# Patient Record
Sex: Female | Born: 1950 | Race: White | Hispanic: No | Marital: Married | Smoking: Never smoker
Health system: Southern US, Community
[De-identification: ages and names within clinical notes are randomized; demographics above are authoritative.]

## PROBLEM LIST (undated history)

## (undated) DIAGNOSIS — E079 Disorder of thyroid, unspecified: Secondary | ICD-10-CM

## (undated) DIAGNOSIS — E119 Type 2 diabetes mellitus without complications: Secondary | ICD-10-CM

## (undated) HISTORY — PX: CHOLECYSTECTOMY: SHX55

## (undated) HISTORY — PX: TOE SURGERY: SHX1073

## (undated) HISTORY — PX: OTHER SURGICAL HISTORY: SHX169

---

## 2003-03-14 ENCOUNTER — Ambulatory Visit (HOSPITAL_COMMUNITY): Admission: RE | Admit: 2003-03-14 | Discharge: 2003-03-14 | Payer: Self-pay | Admitting: *Deleted

## 2004-04-28 ENCOUNTER — Encounter: Admission: RE | Admit: 2004-04-28 | Discharge: 2004-06-11 | Payer: Self-pay | Admitting: Allergy

## 2007-03-14 ENCOUNTER — Ambulatory Visit (HOSPITAL_COMMUNITY): Admission: RE | Admit: 2007-03-14 | Discharge: 2007-03-14 | Payer: Self-pay | Admitting: *Deleted

## 2010-02-25 ENCOUNTER — Emergency Department (HOSPITAL_COMMUNITY): Admission: EM | Admit: 2010-02-25 | Discharge: 2010-02-25 | Payer: Self-pay | Admitting: Emergency Medicine

## 2010-09-16 ENCOUNTER — Ambulatory Visit (HOSPITAL_COMMUNITY): Admission: RE | Admit: 2010-09-16 | Discharge: 2010-09-16 | Payer: Self-pay | Admitting: Gastroenterology

## 2011-03-08 LAB — CBC
HCT: 42.3 % (ref 36.0–46.0)
Hemoglobin: 14.3 g/dL (ref 12.0–15.0)
MCHC: 33.7 g/dL (ref 30.0–36.0)
MCV: 89.9 fL (ref 78.0–100.0)
Platelets: 288 10*3/uL (ref 150–400)
WBC: 7.1 10*3/uL (ref 4.0–10.5)

## 2011-03-08 LAB — POCT I-STAT, CHEM 8
Calcium, Ion: 1.11 mmol/L — ABNORMAL LOW (ref 1.12–1.32)
Creatinine, Ser: 0.6 mg/dL (ref 0.4–1.2)
Glucose, Bld: 120 mg/dL — ABNORMAL HIGH (ref 70–99)
HCT: 44 % (ref 36.0–46.0)
Hemoglobin: 15 g/dL (ref 12.0–15.0)

## 2011-03-08 LAB — DIFFERENTIAL
Basophils Relative: 0 % (ref 0–1)
Eosinophils Absolute: 0.1 10*3/uL (ref 0.0–0.7)
Lymphs Abs: 1.6 10*3/uL (ref 0.7–4.0)
Neutrophils Relative %: 72 % (ref 43–77)

## 2011-03-08 LAB — POCT CARDIAC MARKERS
CKMB, poc: <1 ng/mL — ABNORMAL LOW (ref 1.0–8.0)
Troponin i, poc: <0.05 ng/mL (ref 0.00–0.09)

## 2011-03-08 LAB — URINE CULTURE

## 2011-03-08 LAB — URINALYSIS, ROUTINE W REFLEX MICROSCOPIC
Nitrite: NEGATIVE
Protein, ur: NEGATIVE mg/dL
Urobilinogen, UA: 1 mg/dL (ref 0.0–1.0)

## 2011-03-08 LAB — URINE MICROSCOPIC-ADD ON

## 2011-04-30 NOTE — Op Note (Signed)
   NAME:  Leah Carney, Leah Carney                        ACCOUNT NO.:  [id number]   MEDICAL RECORD NO.:  [id number]                   PATIENT TYPE:  AMB   LOCATION:  ENDO                                 FACILITY:  [facility]   PHYSICIAN:  Georgiana Spinner, M.D.                 DATE OF BIRTH:  1951-06-14   DATE OF PROCEDURE:  03/14/2003  DATE OF DISCHARGE:                                 OPERATIVE REPORT   PROCEDURE PERFORMED:  Upper endoscopy.   ENDOSCOPIST:  Georgiana Spinner, M.D.   INDICATIONS FOR PROCEDURE:  Dysphagia.   ANESTHESIA:  Demerol 80 mg, Versed 8.5 mg.   DESCRIPTION OF PROCEDURE:  With the patient mildly sedated in the left  lateral decubitus position, the Olympus video endoscope was inserted in the  mouth and passed under direct vision through the esophagus which appeared  normal.  We popped through the GE junction .  The fundus, body, antrum,  duodenal bulb and second portion of the duodenum all appeared normal.  From  this point, the endoscope was slowly withdrawn taking circumferential views  of the entire duodenal mucosa until the endoscope was pulled back into the  stomach and placed on retroflexion to view the stomach from below.  The  endoscope was then straightened and a guidewire was passed.  The endoscope  was withdrawn.  Subsequently Savary dilators, 14, 16, 18 were passed easily  over the guidewire.  With the latter, the guidewire was removed.  The  endoscope was reinserted.  No abnormalities were noted in the esophagus and  the stomach.  The endoscope was then withdrawn taking circumferential views  of the remaining gastric and esophageal mucosa.  The patient's vital signs  and pulse oximeter remained stable.  The patient tolerated the procedure  well without apparent complications.   FINDINGS:  Unremarkable examination with Savary dilation 14, 16 and 18 of  the distal esophagus.   PLAN:  Await clinical response.  The patient will follow up with me as an  outpatient,  proceed to colonoscopy as planned.                                               Georgiana Spinner, M.D.    GMO/MEDQ  D:  03/14/2003  T:  03/14/2003  Job:  [id number]

## 2011-04-30 NOTE — Op Note (Signed)
NAMECHYREL, TAHA              ACCOUNT NO.:  [id number]   MEDICAL RECORD NO.:  [id number]          PATIENT TYPE:  AMB   LOCATION:  ENDO                         FACILITY:  [facility]   PHYSICIAN:  Georgiana Spinner, M.D.    DATE OF BIRTH:  [birth year]   DATE OF PROCEDURE:  DATE OF DISCHARGE:                               OPERATIVE REPORT   PROCEDURE:  Upper endoscopy with Savary dilation.   INDICATIONS:  Dysphagia.   ANESTHESIA:  Fentanyl 70 mcg, Versed 7 mg.   PROCEDURE:  With the patient mildly sedated in the left lateral  decubitus position, the Pentax videoscopic endoscope was inserted and  passed under direct vision through the esophagus which appeared normal  into the stomach.  The fundus, body, antrum, duodenal bulb, and second  portion of the duodenum were visualized.  From this point the endoscope  was slowly withdrawn, taking circumferential views of the duodenal  mucosa until the endoscope had been pulled back into the stomach, placed  in retroflexion to view the stomach from below.  The endoscope was  straightened and withdrawn after passing a guidewire.  Subsequently, a  17 Savary dilator was passed over the guidewire with fluoroscopic  guidance.  Once it had entered into the stomach, the guidewire and the  dilator were withdrawn.  The endoscope was then reinserted into the  stomach and withdrawn.  The patient's vital signs and pulse oximeter  remained stable.  The patient tolerated the procedure well without  apparent complications.   FINDINGS:  Tightness of the distal esophagus, dilated to 17 mm.  Await  clinical response.  The patient will follow up with me as needed as an  outpatient.           ______________________________  Georgiana Spinner, M.D.     GMO/MEDQ  D:  03/14/2007  T:  03/14/2007  Job:  [id number]

## 2011-04-30 NOTE — Op Note (Signed)
   NAME:  Leah Carney, Leah Carney                        ACCOUNT NO.:  [id number]   MEDICAL RECORD NO.:  [id number]                   PATIENT TYPE:  AMB   LOCATION:  ENDO                                 FACILITY:  [facility]   PHYSICIAN:  Georgiana Spinner, M.D.                 DATE OF BIRTH:  Nov 19, 1951   DATE OF PROCEDURE:  03/14/2003  DATE OF DISCHARGE:                                 OPERATIVE REPORT   PROCEDURE PERFORMED:  Colonoscopy.   ENDOSCOPIST:  Georgiana Spinner, M.D.   INDICATIONS FOR PROCEDURE:  Colon cancer screening.   ANESTHESIA:  Demerol 90 mg, Versed 9 mg.   DESCRIPTION OF PROCEDURE:  With the patient mildly sedated in the left  lateral decubitus position, the Olympus video colonoscope was inserted in  the rectum and passed under direct vision through a very tortuous colon.  The patient was positioned in multiple positions with pressure applied to  the abdomen.  We finally reached the cecum, identified by the ileocecal  valve and crow's foot of the cecum.  From this point the colonoscope was  slowly withdrawn taking circumferential views of the entire colonic mucosa  visualized, stopping only in the rectum which appeared normal on direct and  showed hemorrhoids on retroflex view.  The endoscope was straightened and  withdrawn.  The patient's vital signs and pulse oximeter remained stable.  The patient tolerated the procedure well without apparent complications.   FINDINGS:  Internal hemorrhoids.  Otherwise unremarkable colonoscopic  examination notable for tortuosity in length of the colon.                                                   Georgiana Spinner, M.D.    GMO/MEDQ  D:  03/14/2003  T:  03/14/2003  Job:  [id number]

## 2014-12-14 ENCOUNTER — Emergency Department (HOSPITAL_COMMUNITY)
Admission: EM | Admit: 2014-12-14 | Discharge: 2014-12-14 | Disposition: A | Discharge status: Home / Self Care | Attending: Emergency Medicine | Admitting: Emergency Medicine

## 2014-12-14 ENCOUNTER — Encounter (HOSPITAL_COMMUNITY): Payer: Self-pay | Admitting: Emergency Medicine

## 2014-12-14 DIAGNOSIS — Y9389 Activity, other specified: Secondary | ICD-10-CM | POA: Diagnosis not present

## 2014-12-14 DIAGNOSIS — Y92009 Unspecified place in unspecified non-institutional (private) residence as the place of occurrence of the external cause: Secondary | ICD-10-CM | POA: Insufficient documentation

## 2014-12-14 DIAGNOSIS — X58XXXA Exposure to other specified factors, initial encounter: Secondary | ICD-10-CM | POA: Diagnosis not present

## 2014-12-14 DIAGNOSIS — Z79899 Other long term (current) drug therapy: Secondary | ICD-10-CM | POA: Insufficient documentation

## 2014-12-14 DIAGNOSIS — Y998 Other external cause status: Secondary | ICD-10-CM | POA: Diagnosis not present

## 2014-12-14 DIAGNOSIS — T7840XA Allergy, unspecified, initial encounter: Secondary | ICD-10-CM

## 2014-12-14 DIAGNOSIS — T7806XA Anaphylactic reaction due to food additives, initial encounter: Secondary | ICD-10-CM

## 2014-12-14 DIAGNOSIS — R55 Syncope and collapse: Secondary | ICD-10-CM | POA: Diagnosis present

## 2014-12-14 LAB — CBC WITH DIFFERENTIAL/PLATELET
BASOS ABS: 0 10*3/uL (ref 0.0–0.1)
Basophils Relative: 0 % (ref 0–1)
EOS ABS: 0.1 10*3/uL (ref 0.0–0.7)
Eosinophils Relative: 0 % (ref 0–5)
HEMATOCRIT: 43.6 % (ref 36.0–46.0)
HEMOGLOBIN: 14.4 g/dL (ref 12.0–15.0)
Lymphocytes Relative: 15 % (ref 12–46)
Lymphs Abs: 2.7 10*3/uL (ref 0.7–4.0)
MCH: 30.3 pg (ref 26.0–34.0)
MCHC: 33 g/dL (ref 30.0–36.0)
MCV: 91.6 fL (ref 78.0–100.0)
MONO ABS: 1.1 10*3/uL — AB (ref 0.1–1.0)
Monocytes Relative: 7 % (ref 3–12)
Neutro Abs: 13.4 10*3/uL — ABNORMAL HIGH (ref 1.7–7.7)
Neutrophils Relative %: 78 % — ABNORMAL HIGH (ref 43–77)
Platelets: 271 10*3/uL (ref 150–400)
RBC: 4.76 MIL/uL (ref 3.87–5.11)
RDW: 13.1 % (ref 11.5–15.5)
WBC: 17.2 10*3/uL — ABNORMAL HIGH (ref 4.0–10.5)

## 2014-12-14 LAB — COMPREHENSIVE METABOLIC PANEL
ALBUMIN: 3.7 g/dL (ref 3.5–5.2)
ALK PHOS: 91 U/L (ref 39–117)
ALT: 25 U/L (ref 0–35)
ANION GAP: 10 (ref 5–15)
AST: 30 U/L (ref 0–37)
BILIRUBIN TOTAL: 0.4 mg/dL (ref 0.3–1.2)
BUN: 19 mg/dL (ref 6–23)
CALCIUM: 8.6 mg/dL (ref 8.4–10.5)
CHLORIDE: 104 meq/L (ref 96–112)
CO2: 26 mmol/L (ref 19–32)
CREATININE: 0.69 mg/dL (ref 0.50–1.10)
GFR calc Af Amer: >90 mL/min (ref >90)
GFR calc non Af Amer: >90 mL/min (ref >90)
GLUCOSE: 157 mg/dL — AB (ref 70–99)
POTASSIUM: 4.2 mmol/L (ref 3.5–5.1)
Sodium: 140 mmol/L (ref 135–145)
Total Protein: 7 g/dL (ref 6.0–8.3)

## 2014-12-14 LAB — TROPONIN I: Troponin I: <0.03 ng/mL (ref <0.031)

## 2014-12-14 LAB — LIPASE, BLOOD: LIPASE: 34 U/L (ref 11–59)

## 2014-12-14 MED ORDER — DIPHENHYDRAMINE HCL 50 MG/ML IJ SOLN
25.0000 mg | Freq: Once | INTRAMUSCULAR | Status: AC
  Administered 2014-12-14: 25 mg via INTRAVENOUS
  Filled 2014-12-14: qty 1

## 2014-12-14 MED ORDER — FAMOTIDINE 20 MG PO TABS: 20.0000 mg | ORAL_TABLET | Freq: Two times a day (BID) | ORAL | Status: DC

## 2014-12-14 MED ORDER — DIPHENHYDRAMINE HCL 25 MG PO CAPS
25.0000 mg | ORAL_CAPSULE | Freq: Once | ORAL | Status: AC
  Administered 2014-12-14: 25 mg via ORAL
  Filled 2014-12-14: qty 1

## 2014-12-14 MED ORDER — DIPHENHYDRAMINE HCL 25 MG PO TABS: 50.0000 mg | ORAL_TABLET | ORAL | Status: DC | PRN

## 2014-12-14 MED ORDER — FAMOTIDINE IN NACL 20-0.9 MG/50ML-% IV SOLN
20.0000 mg | Freq: Once | INTRAVENOUS | Status: AC
  Administered 2014-12-14: 20 mg via INTRAVENOUS
  Filled 2014-12-14: qty 50

## 2014-12-14 MED ORDER — PREDNISONE 20 MG PO TABS: ORAL_TABLET | ORAL | Status: DC

## 2014-12-14 MED ORDER — METHYLPREDNISOLONE SODIUM SUCC 125 MG IJ SOLR
125.0000 mg | Freq: Once | INTRAMUSCULAR | Status: AC
  Administered 2014-12-14: 125 mg via INTRAVENOUS
  Filled 2014-12-14: qty 2

## 2014-12-14 MED ORDER — SODIUM CHLORIDE 0.9 % IV BOLUS (SEPSIS)
500.0000 mL | Freq: Once | INTRAVENOUS | Status: AC
  Administered 2014-12-14: 500 mL via INTRAVENOUS

## 2014-12-14 MED ORDER — PREDNISONE 20 MG PO TABS
60.0000 mg | ORAL_TABLET | Freq: Once | ORAL | Status: AC
  Administered 2014-12-14: 60 mg via ORAL
  Filled 2014-12-14: qty 3

## 2014-12-14 MED ORDER — EPINEPHRINE 0.3 MG/0.3ML IJ SOAJ: 0.3000 mg | Freq: Once | INTRAMUSCULAR | Status: DC

## 2014-12-14 NOTE — ED Provider Notes (Signed)
CSN: [id number]     Arrival date & time 12/14/14  1715 History   First MD Initiated Contact with Patient 12/14/14 1731     Chief Complaint  Patient presents with  . Near Syncope     (Consider location/radiation/quality/duration/timing/severity/associated sxs/prior Treatment) HPI The patient had had a meal with her family. They were at home. She was feeling fine. She suddenly got a sensation of having to go to the bathroom. She made it into the bathroom and immediately had to have both diarrhea and the feeling of needing to vomit. She was able to sit on the commode and have a bowel movement. She reports she became extremely hot and was sweating profusely. She got up and walked into the hallway and collapsed. She reports that she was able to call for her family who came immediately to assist her. She was very flushed and profusely sweating still. Her failure who is with her reports that she was breathing rather rapidly as well. She denies she had chest pain. She reports she just felt immobilized and had pins and needles feelings all over her. The medics came and started an IV. No other medications were ad minister. The symptoms have improved significantly. She reports she is still extremely flushed and her hands and feet are swollen and itching. The patient denies history of an allergic reaction. She reports the foods were all things she has had previously. History reviewed. No pertinent past medical history. Past Surgical History  Procedure Laterality Date  . Cholecystectomy    . Fibroidectomy     No family history on file. History  Substance Use Topics  . Smoking status: Never Smoker   . Smokeless tobacco: Not on file  . Alcohol Use: No   OB History    No data available     Review of Systems 10 Systems reviewed and are negative for acute change except as noted in the HPI.    Allergies  Review of patient's allergies indicates no known allergies.  Home Medications   Prior to  Admission medications   Medication Sig Start Date End Date Taking? Authorizing Provider  aspirin-acetaminophen-caffeine (EXCEDRIN MIGRAINE) [phone] MG per tablet Take 2 tablets by mouth every 6 (six) hours as needed for headache.   Yes Historical Provider, MD  Calcium Citrate-Vitamin D (CITRACAL + D PO) Take 1 tablet by mouth daily.   Yes Historical Provider, MD  diphenhydrAMINE (BENADRYL) 25 MG tablet Take 25 mg by mouth every 6 (six) hours as needed for itching.   Yes Historical Provider, MD  Oxymetazoline HCl (MUCINEX NASAL SPRAY MOISTURE NA) Place 1 spray into the nose daily as needed (congestion).   Yes Historical Provider, MD  pseudoephedrine (SUDAFED) 60 MG tablet Take 60 mg by mouth every 4 (four) hours as needed for congestion.   Yes Historical Provider, MD  venlafaxine (EFFEXOR) 75 MG tablet Take 225 mg by mouth every morning.   Yes Historical Provider, MD  diphenhydrAMINE (BENADRYL) 25 MG tablet Take 2 tablets (50 mg total) by mouth every 4 (four) hours as needed for itching. 12/14/14   Arby Barrette, MD  EPINEPHrine (EPIPEN 2-PAK) 0.3 mg/0.3 mL IJ SOAJ injection Inject 0.3 mLs (0.3 mg total) into the muscle once. 12/14/14   Arby Barrette, MD  famotidine (PEPCID) 20 MG tablet Take 1 tablet (20 mg total) by mouth 2 (two) times daily. 12/14/14   Arby Barrette, MD  predniSONE (DELTASONE) 20 MG tablet 3 tabs po day one, then 2 po daily x 4 days  12/14/14   Arby Barrette, MD   BP 119/61 mmHg  Pulse 74  Temp(Src) 97.7 F (36.5 C) (Oral)  Resp 20  SpO2 99% Physical Exam  Constitutional: She is oriented to person, place, and time. She appears well-developed and well-nourished.  HENT:  Head: Normocephalic and atraumatic.  Mouth/Throat: No oropharyngeal exudate.  Posterior oropharynx is widely patent. There is no intraoral edema. No stridor.  Eyes: EOM are normal. Pupils are equal, round, and reactive to light.  Neck: Neck supple.  Cardiovascular: Normal rate, regular rhythm, normal heart  sounds and intact distal pulses.   Pulmonary/Chest: Effort normal and breath sounds normal. No respiratory distress. She has no wheezes.  Abdominal: Soft. Bowel sounds are normal. She exhibits no distension. There is no tenderness.  Musculoskeletal: Normal range of motion. She exhibits no edema.  Neurological: She is alert and oriented to person, place, and time. She has normal strength. Coordination normal. GCS eye subscore is 4. GCS verbal subscore is 5. GCS motor subscore is 6.  Skin: Skin is warm, dry and intact. Rash noted.  The patient upper body is very flushed. She has urticarial rash on her forearms. She also has urticarial rash and mild edema of both hands and both feet. The trunk and lower extremities are predominantly spared.  Psychiatric: She has a normal mood and affect.    ED Course  Procedures (including critical care time) Labs Review Labs Reviewed  COMPREHENSIVE METABOLIC PANEL - Abnormal; Notable for the following:    Glucose, Bld 157 (*)    All other components within normal limits  CBC WITH DIFFERENTIAL - Abnormal; Notable for the following:    WBC 17.2 (*)    Neutrophils Relative % 78 (*)    Neutro Abs 13.4 (*)    Monocytes Absolute 1.1 (*)    All other components within normal limits  LIPASE, BLOOD  TROPONIN I    Imaging Review No results found.   EKG Interpretation   Date/Time:  Saturday December 14 2014 17:28:27 EST Ventricular Rate:  78 PR Interval:  153 QRS Duration: 99 QT Interval:  408 QTC Calculation: 465 R Axis:   5 Text Interpretation:  Sinus rhythm Abnormal R-wave progression, early  transition normal. no change from old. Confirmed by Donnald Garre, MD, Le[facility] Conners  [phone]) on 12/14/2014 6:03:42 PM     21:15 rash has resolved. Vital signs are stable. Patient has not developed any rebound of symptoms. 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 It appears patient had an anaphylactic type reaction in the field. She did have diffuse urticarial rash of her upper body upon arrival here, however there was no wheezing present, no oral symptoms no dysphasia. Prior to arrival however classic GI symptoms with reported shortness of breath and a near syncopal type episode. The patient was not treated in the field. Her symptoms had begun to significantly abate prior to arrival. Upon her arrival the predominant remaining symptom was urticaria of her upper body and hands and feet. The patient does not have prior history of allergies. At this point is appears highly likely to be a food-related reaction. The patient is counseled on avoiding all foods that might have any types of dyes, preservatives or flavor enhancers. She is to go home and make a chart of all foods eaten today and try to obtain labeling.  The patient is then to follow-up with an allergist. They will be picking up an EpiPen after discharge and the patient and her husband who is a [employer] fundraiser are aware of signs and symptoms for which to return.    Arby Barrette, MD 12/14/14 2148

## 2014-12-14 NOTE — ED Notes (Signed)
2x attempt to collect blood right AC

## 2014-12-14 NOTE — ED Notes (Addendum)
Upon assessment pt reports talking with family and felt fine but urge to have BM. Pt reports with BM become sweaty, dizzy, and weak. Pt reports same episode four years ago and diagnosed with vasovagal syncope. Pt continues to reports generalized itching to hands at home; feet itching at present time; pt generalized redness.

## 2014-12-14 NOTE — Discharge Instructions (Signed)
Anaphylactic Reaction An anaphylactic reaction is a sudden, severe allergic reaction that involves the whole body. It can be life threatening. A hospital stay is often required. People with asthma, eczema, or hay fever are slightly more likely to have an anaphylactic reaction. CAUSES  An anaphylactic reaction may be caused by anything to which you are allergic. After being exposed to the allergic substance, your immune system becomes sensitized to it. When you are exposed to that allergic substance again, an allergic reaction can occur. Common causes of an anaphylactic reaction include:  Medicines.  Foods, especially peanuts, wheat, shellfish, milk, and eggs.  Insect bites or stings.  Blood products.  Chemicals, such as dyes, latex, and contrast material used for imaging tests. SYMPTOMS  When an allergic reaction occurs, the body releases histamine and other substances. These substances cause symptoms such as tightening of the airway. Symptoms often develop within seconds or minutes of exposure. Symptoms may include:  Skin rash or hives.  Itching.  Chest tightness.  Swelling of the eyes, tongue, or lips.  Trouble breathing or swallowing.  Lightheadedness or fainting.  Anxiety or confusion.  Stomach pains, vomiting, or diarrhea.  Nasal congestion.  A fast or irregular heartbeat (palpitations). DIAGNOSIS  Diagnosis is based on your history of recent exposure to allergic substances, your symptoms, and a physical exam. Your caregiver may also perform blood or urine tests to confirm the diagnosis. TREATMENT  Epinephrine medicine is the main treatment for an anaphylactic reaction. Other medicines that may be used for treatment include antihistamines, steroids, and albuterol. In severe cases, fluids and medicine to support blood pressure may be given through an intravenous line (IV). Even if you improve after treatment, you need to be observed to make sure your condition does not get  worse. This may require a stay in the hospital. Worth a medical alert bracelet or necklace stating your allergy.  You and your family must learn how to use an anaphylaxis kit or give an epinephrine injection to temporarily treat an emergency allergic reaction. Always carry your epinephrine injection or anaphylaxis kit with you. This can be lifesaving if you have a severe reaction.  Do not drive or perform tasks after treatment until the medicines used to treat your reaction have worn off, or until your caregiver says it is okay.  If you have hives or a rash:  Take medicines as directed by your caregiver.  You may use an over-the-counter antihistamine (diphenhydramine) as needed.  Apply cold compresses to the skin or take baths in cool water. Avoid hot baths or showers. SEEK MEDICAL CARE IF:   You develop symptoms of an allergic reaction to a new substance. Symptoms may start right away or minutes later.  You develop a rash, hives, or itching.  You develop new symptoms. SEEK IMMEDIATE MEDICAL CARE IF:   You have swelling of the mouth, difficulty breathing, or wheezing.  You have a tight feeling in your chest or throat.  You develop hives, swelling, or itching all over your body.  You develop severe vomiting or diarrhea.  You feel faint or pass out. This is an emergency. Use your epinephrine injection or anaphylaxis kit as you have been instructed. Call your local emergency services (911 in [place] Even if you improve after the injection, you need to be examined at a hospital emergency department. MAKE SURE YOU:   Understand these instructions.  Will watch your condition.  Will get help right away if you are not  doing well or get worse. Document Released: 11/29/2005 Document Revised: 12/04/2013 Document Reviewed: 03/01/2012 [employer] [facility] [employer] Patient Information 2015 [place], [place]. This information is not intended to replace advice given to you by your health  care provider. Make sure you discuss any questions you have with your health care provider.

## 2014-12-14 NOTE — ED Notes (Signed)
Per EMS pt was sitting with family and become hot, diaphoretic, and dizziness. Pt states need for BM post episode. Pt reports diarrhea and generalized body aches and weakness with movement. Pt given 550 NS and EKG read NSR unremarkable.

## 2014-12-14 NOTE — ED Notes (Signed)
Bed: WU13 Expected date:  Expected time:  Means of arrival:  Comments: Syncopal episode

## 2015-06-15 ENCOUNTER — Emergency Department (HOSPITAL_COMMUNITY)

## 2015-06-15 ENCOUNTER — Emergency Department (HOSPITAL_COMMUNITY)
Admission: EM | Admit: 2015-06-15 | Discharge: 2015-06-15 | Disposition: A | Discharge status: Home / Self Care | Attending: Emergency Medicine | Admitting: Emergency Medicine

## 2015-06-15 ENCOUNTER — Encounter (HOSPITAL_COMMUNITY): Payer: Self-pay

## 2015-06-15 DIAGNOSIS — Z79899 Other long term (current) drug therapy: Secondary | ICD-10-CM | POA: Diagnosis not present

## 2015-06-15 DIAGNOSIS — R109 Unspecified abdominal pain: Secondary | ICD-10-CM | POA: Diagnosis present

## 2015-06-15 DIAGNOSIS — N200 Calculus of kidney: Secondary | ICD-10-CM | POA: Diagnosis not present

## 2015-06-15 DIAGNOSIS — Z9049 Acquired absence of other specified parts of digestive tract: Secondary | ICD-10-CM | POA: Insufficient documentation

## 2015-06-15 LAB — CBC WITH DIFFERENTIAL/PLATELET
Basophils Absolute: 0.1 10*3/uL (ref 0.0–0.1)
Basophils Relative: 1 % (ref 0–1)
Eosinophils Absolute: 0.3 10*3/uL (ref 0.0–0.7)
Eosinophils Relative: 3 % (ref 0–5)
HCT: 43.6 % (ref 36.0–46.0)
Hemoglobin: 14.7 g/dL (ref 12.0–15.0)
Lymphocytes Relative: 36 % (ref 12–46)
Lymphs Abs: 3.8 10*3/uL (ref 0.7–4.0)
MCH: 30.2 pg (ref 26.0–34.0)
MCHC: 33.7 g/dL (ref 30.0–36.0)
MCV: 89.7 fL (ref 78.0–100.0)
Monocytes Absolute: 0.7 10*3/uL (ref 0.1–1.0)
Monocytes Relative: 7 % (ref 3–12)
Neutro Abs: 5.8 10*3/uL (ref 1.7–7.7)
Neutrophils Relative %: 53 % (ref 43–77)
Platelets: 289 10*3/uL (ref 150–400)
RBC: 4.86 MIL/uL (ref 3.87–5.11)
RDW: 13.3 % (ref 11.5–15.5)
WBC: 10.6 10*3/uL — ABNORMAL HIGH (ref 4.0–10.5)

## 2015-06-15 LAB — I-STAT CHEM 8, ED
BUN: 23 mg/dL — ABNORMAL HIGH (ref 6–20)
CALCIUM ION: 1.2 mmol/L (ref 1.13–1.30)
Chloride: 104 mmol/L (ref 101–111)
Creatinine, Ser: 0.9 mg/dL (ref 0.44–1.00)
Glucose, Bld: 171 mg/dL — ABNORMAL HIGH (ref 65–99)
HCT: 46 % (ref 36.0–46.0)
HEMOGLOBIN: 15.6 g/dL — AB (ref 12.0–15.0)
POTASSIUM: 4 mmol/L (ref 3.5–5.1)
Sodium: 142 mmol/L (ref 135–145)
TCO2: 24 mmol/L (ref 0–100)

## 2015-06-15 LAB — URINALYSIS, ROUTINE W REFLEX MICROSCOPIC
Glucose, UA: NEGATIVE mg/dL
Ketones, ur: NEGATIVE mg/dL
Leukocytes, UA: NEGATIVE
Nitrite: NEGATIVE
Protein, ur: NEGATIVE mg/dL
Specific Gravity, Urine: 1.041 — ABNORMAL HIGH (ref 1.005–1.030)
Urobilinogen, UA: 0.2 mg/dL (ref 0.0–1.0)
pH: 5 (ref 5.0–8.0)

## 2015-06-15 LAB — URINE MICROSCOPIC-ADD ON

## 2015-06-15 MED ORDER — HYDROCODONE-ACETAMINOPHEN 5-325 MG PO TABS: 1.0000 | ORAL_TABLET | ORAL | Status: DC | PRN

## 2015-06-15 MED ORDER — SODIUM CHLORIDE 0.9 % IV BOLUS (SEPSIS)
500.0000 mL | Freq: Once | INTRAVENOUS | Status: AC
  Administered 2015-06-15: 500 mL via INTRAVENOUS

## 2015-06-15 MED ORDER — KETOROLAC TROMETHAMINE 30 MG/ML IJ SOLN
30.0000 mg | Freq: Once | INTRAMUSCULAR | Status: AC
  Administered 2015-06-15: 30 mg via INTRAVENOUS
  Filled 2015-06-15: qty 1

## 2015-06-15 MED ORDER — ONDANSETRON HCL 4 MG/2ML IJ SOLN
4.0000 mg | Freq: Once | INTRAMUSCULAR | Status: AC
  Administered 2015-06-15: 4 mg via INTRAVENOUS
  Filled 2015-06-15: qty 2

## 2015-06-15 MED ORDER — HYDROMORPHONE HCL 1 MG/ML IJ SOLN
1.0000 mg | Freq: Once | INTRAMUSCULAR | Status: AC
  Administered 2015-06-15: 1 mg via INTRAVENOUS
  Filled 2015-06-15: qty 1

## 2015-06-15 MED ORDER — ONDANSETRON HCL 4 MG PO TABS: 4.0000 mg | ORAL_TABLET | Freq: Four times a day (QID) | ORAL | Status: DC

## 2015-06-15 MED ORDER — TAMSULOSIN HCL 0.4 MG PO CAPS: 0.4000 mg | ORAL_CAPSULE | Freq: Every day | ORAL | Status: DC

## 2015-06-15 NOTE — ED Notes (Signed)
Pt presents with c/o left side flank pain that started yesterday. Pt reports the pain is also radiating to her abdominal area. Pt reports some urinary frequency and urgency, denies any blood in her urine. Pt reports some nausea but no vomiting or diarrhea. No hx of kidney stones reported.

## 2015-06-15 NOTE — ED Notes (Signed)
Pt is tearful, in severe 10/10 pain, and nauseated again. MD notified.

## 2015-06-15 NOTE — ED Provider Notes (Signed)
CSN: [id number]     Arrival date & time 06/15/15  1521 History   First MD Initiated Contact with Patient 06/15/15 1531     Chief Complaint  Patient presents with  . Flank Pain  . Abdominal Pain     (Consider location/radiation/quality/duration/timing/severity/associated sxs/prior Treatment) HPI Comments: Pain initially was a 5/10 yesterday and became a 10 out of 10 today feeling like natural labor pains. Sharp and radiating into her left lower quadrant  Patient is a 64 y.o. female presenting with flank pain and abdominal pain. The history is provided by the patient.  Flank Pain This is a new (had pain in the left flank starting yesterday and got much worse at 12 today.) problem. The current episode started yesterday. The problem occurs constantly. The problem has been rapidly worsening. Associated symptoms include abdominal pain. Associated symptoms comments: Nausea without vomiting.  Sweating.  No fever or diarrhea. Urinary frequency, urgency and mild dysuria that started with the pain. No hematuria.. Nothing aggravates the symptoms. Nothing relieves the symptoms. Treatments tried: Excedrin. The treatment provided no relief.  Abdominal Pain   History reviewed. No pertinent past medical history. Past Surgical History  Procedure Laterality Date  . Cholecystectomy    . Fibroidectomy     No family history on file. History  Substance Use Topics  . Smoking status: Never Smoker   . Smokeless tobacco: Not on file  . Alcohol Use: No   OB History    No data available     Review of Systems  Gastrointestinal: Positive for abdominal pain.  Genitourinary: Positive for flank pain.  All other systems reviewed and are negative.     Allergies  Other  Home Medications   Prior to Admission medications   Medication Sig Start Date End Date Taking? Authorizing Provider  aspirin-acetaminophen-caffeine (EXCEDRIN MIGRAINE) [phone] MG per tablet Take 2 tablets by mouth every 6 (six) hours  as needed for headache.   Yes Historical Provider, MD  Calcium Citrate-Vitamin D (CITRACAL + D PO) Take 1 tablet by mouth daily.   Yes Historical Provider, MD  diphenhydrAMINE (BENADRYL) 25 MG tablet Take 2 tablets (50 mg total) by mouth every 4 (four) hours as needed for itching. 12/14/14  Yes Arby Barrette, MD  EPINEPHrine (EPIPEN 2-PAK) 0.3 mg/0.3 mL IJ SOAJ injection Inject 0.3 mLs (0.3 mg total) into the muscle once. 12/14/14  Yes Arby Barrette, MD  famotidine (PEPCID) 20 MG tablet Take 1 tablet (20 mg total) by mouth 2 (two) times daily. 12/14/14  Yes Arby Barrette, MD  Oxymetazoline HCl (MUCINEX NASAL SPRAY MOISTURE NA) Place 1 spray into the nose daily as needed (congestion).   Yes Historical Provider, MD  pseudoephedrine (SUDAFED) 60 MG tablet Take 60 mg by mouth every 4 (four) hours as needed for congestion.   Yes Historical Provider, MD  venlafaxine (EFFEXOR) 75 MG tablet Take 225 mg by mouth every morning.   Yes Historical Provider, MD  predniSONE (DELTASONE) 20 MG tablet 3 tabs po day one, then 2 po daily x 4 days Patient not taking: Reported on 06/15/2015 12/14/14   Arby Barrette, MD   BP 167/77 mmHg  Pulse 83  Temp(Src) 97.7 F (36.5 C) (Oral)  Resp 24  SpO2 97% Physical Exam  Constitutional: She is oriented to person, place, and time. She appears well-developed and well-nourished. She appears distressed.  Appears uncomfortable  HENT:  Head: Normocephalic and atraumatic.  Mouth/Throat: Oropharynx is clear and moist.  Eyes: Conjunctivae and EOM are normal. Pupils are  equal, round, and reactive to light.  Neck: Normal range of motion. Neck supple.  Cardiovascular: Normal rate, regular rhythm and intact distal pulses.   No murmur heard. Pulmonary/Chest: Effort normal and breath sounds normal. No respiratory distress. She has no wheezes. She has no rales.  Abdominal: Soft. Normal appearance. She exhibits no distension. There is tenderness in the left upper quadrant. There is CVA  tenderness. There is no rebound and no guarding.  Left flank pain  Musculoskeletal: Normal range of motion. She exhibits no edema or tenderness.  Neurological: She is alert and oriented to person, place, and time.  Skin: Skin is warm and dry. No rash noted. No erythema.  Psychiatric: She has a normal mood and affect. Her behavior is normal.  Nursing note and vitals reviewed.   ED Course  Procedures (including critical care time) Labs Review Labs Reviewed  CBC WITH DIFFERENTIAL/PLATELET - Abnormal; Notable for the following:    WBC 10.6 (*)    All other components within normal limits  URINALYSIS, ROUTINE W REFLEX MICROSCOPIC (NOT AT [facility]) - Abnormal; Notable for the following:    APPearance CLOUDY (*)    Specific Gravity, Urine 1.041 (*)    Hgb urine dipstick LARGE (*)    Bilirubin Urine SMALL (*)    All other components within normal limits  URINE MICROSCOPIC-ADD ON - Abnormal; Notable for the following:    Squamous Epithelial / LPF FEW (*)    Bacteria, UA FEW (*)    Crystals CA OXALATE CRYSTALS (*)    All other components within normal limits  I-STAT CHEM 8, ED    Imaging Review Ct Abdomen Pelvis Wo Contrast  06/15/2015   CLINICAL DATA:  Left flank pain starting yesterday.  EXAM: CT ABDOMEN AND PELVIS WITHOUT CONTRAST  TECHNIQUE: Multidetector CT imaging of the abdomen and pelvis was performed following the standard protocol without IV contrast.  COMPARISON:  None.  FINDINGS: There is left hydroureteronephrosis due to obstruction by 6 mm stone in the left ureterovesicular junction. There is no nephrolithiasis bilaterally.  The patient is status post prior cholecystectomy. There is mild fatty infiltration of liver. The spleen, pancreas, adrenal glands are normal. The aorta is normal. There is no abdominal lymphadenopathy. There is no small bowel obstruction or diverticulitis. There is diverticulosis of colon. The appendix is not seen but no inflammation is noted around cecum.  The  bladder is decompressed. Uterine fibroid is identified. Degenerative joint changes of the spine are noted.  IMPRESSION: Left hydroureteronephrosis due to obstruction by a 6 mm stone in the left ureterovesicular junction.   Electronically Signed   By: Sherian Rein M.D.   On: 06/15/2015 16:31     EKG Interpretation None      MDM   Final diagnoses:  Left flank pain  Kidney stone    Pt with symptoms consistent with kidney stone.  Denies infectious sx, or GI symptoms.  Low concern for diverticulitis and no risk factors or history suggestive of AAA.  No hx suggestive of GU source (discharge) and otherwise pt is healthy.  Will hydrate, treat pain and ensure no infection with UA, CBC, CMP and will get stone study to further eval.  5:01 PM Patient with evidence of a 6 mm kidney stone with left-sided hydronephrosis without complicating features such as infection. After the first dose of Dilaudid the pain improved and then returned 30 minutes later. She was given a dose of Dilaudid and Toradol.  6:05 PM Pt's pain is now controlled  2/10 and d/ced home  Gwyneth SproutWhitney Icholas Irby, MD 06/15/15 1805

## 2015-12-26 IMAGING — CT CT ABD-PELV W/O CM
2 of 3 series · 17 of 36 positions shown, 19 images · non-contrast
Comparison: None.

CLINICAL DATA: Left flank pain starting yesterday.

EXAM:
CT ABDOMEN AND PELVIS WITHOUT CONTRAST
TECHNIQUE: Multidetector CT imaging of the abdomen and pelvis was performed
following the standard protocol without IV contrast.

[Series 4: lung · axial · 0.88mm/px · z∈[-157,-62]mm · 14 of 22 slices shown, 16 images]
[im 2/22  soft-tissue]
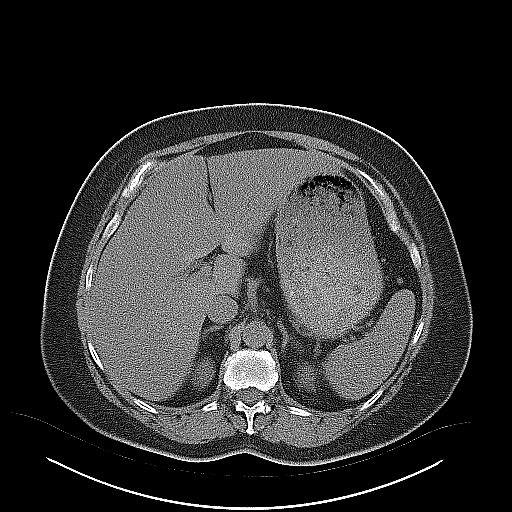
[im 2/22  bone]
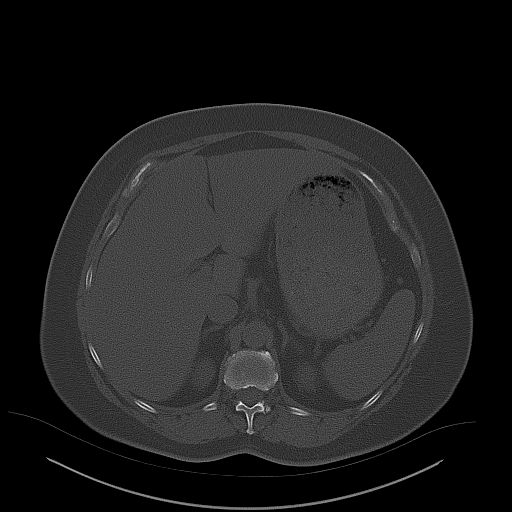
[im 4/22  soft-tissue]
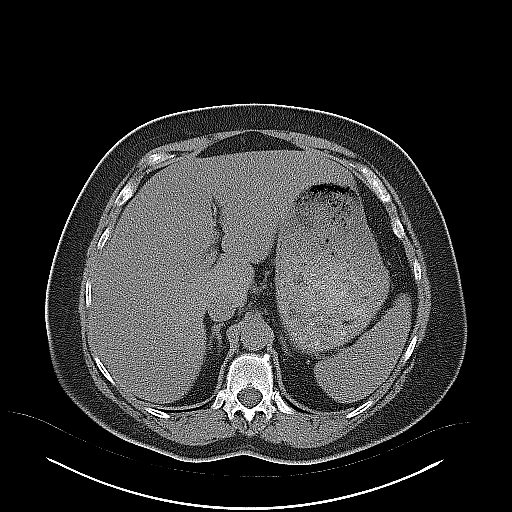
[im 5/22  soft-tissue]
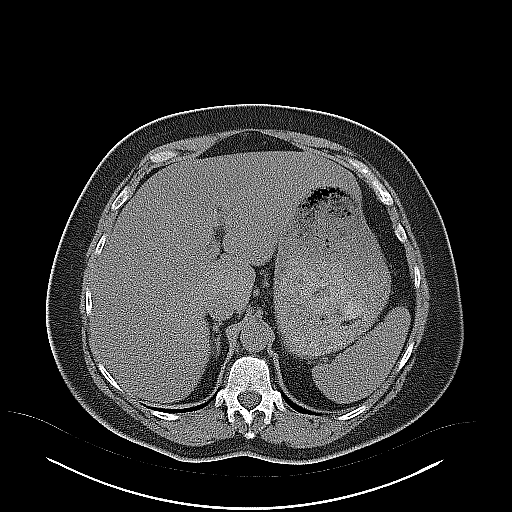
[im 7/22  soft-tissue]
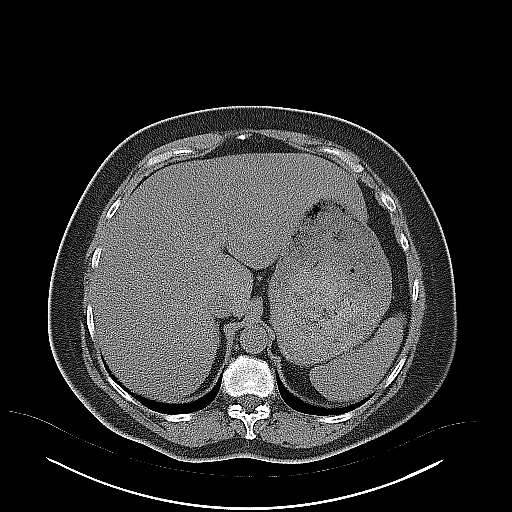
[im 8/22  soft-tissue]
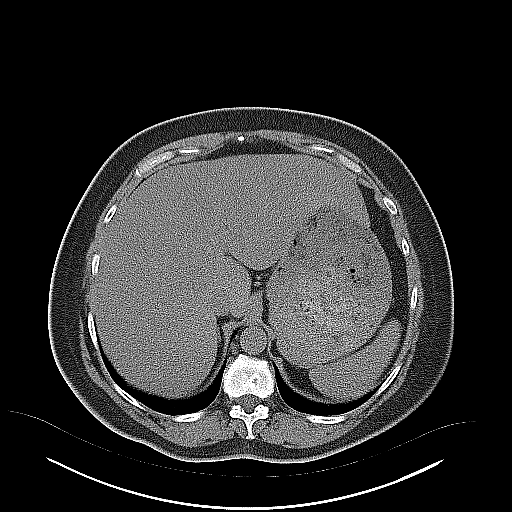
[im 9/22  soft-tissue]
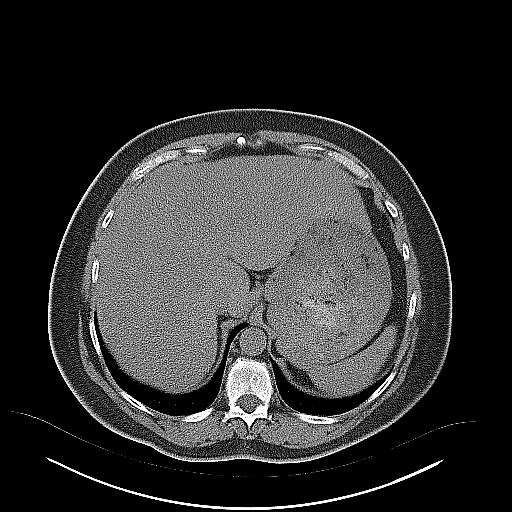
[im 11/22  soft-tissue]
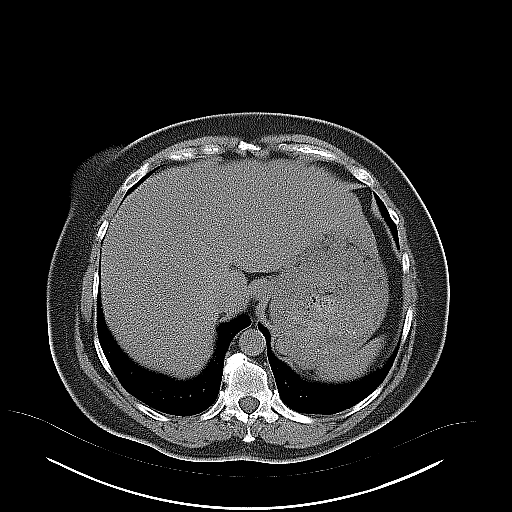
[im 12/22  soft-tissue]
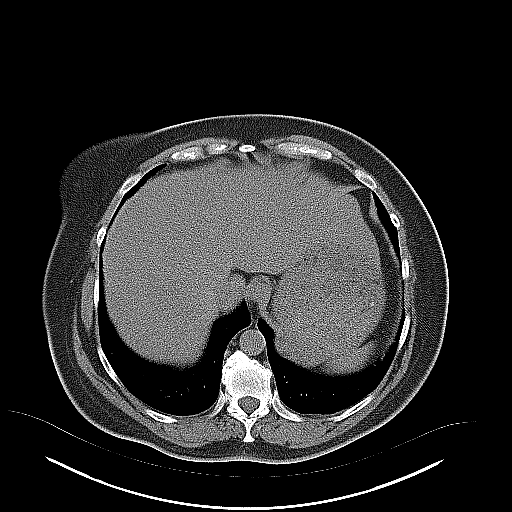
[im 14/22  soft-tissue]
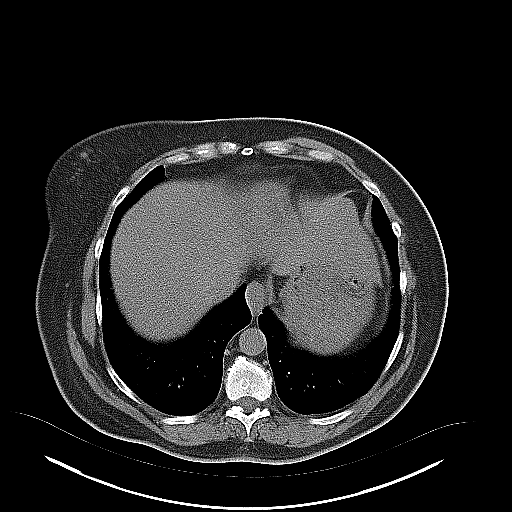
[im 14/22  bone]
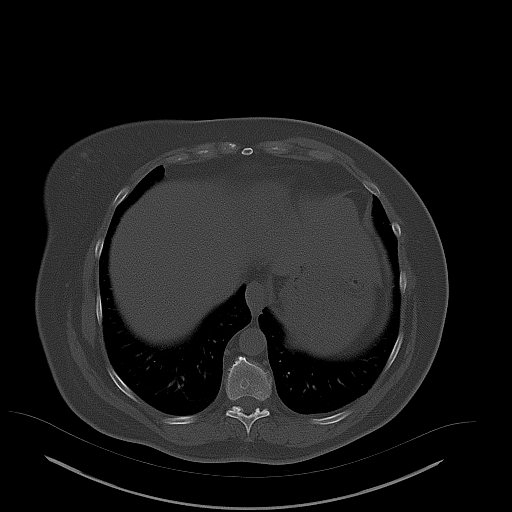
[im 15/22  soft-tissue]
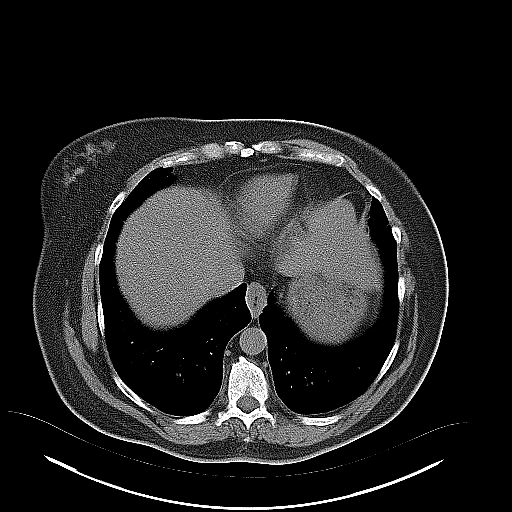
[im 17/22  soft-tissue]
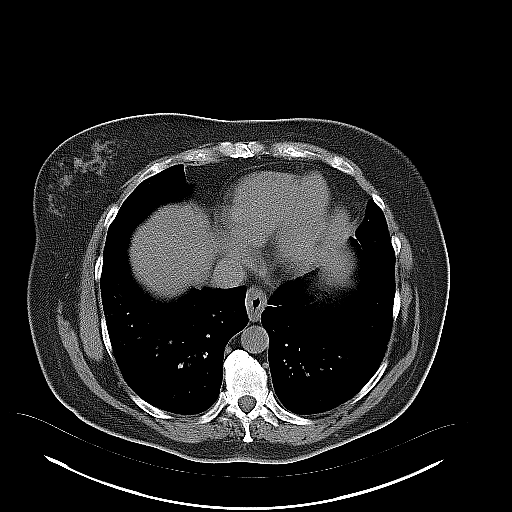
[im 18/22  soft-tissue]
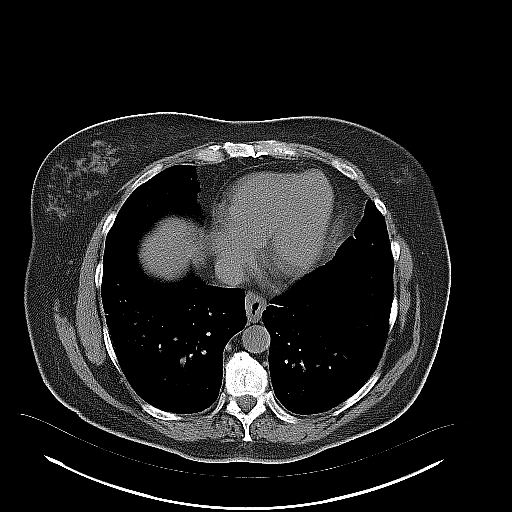
[im 19/22  soft-tissue]
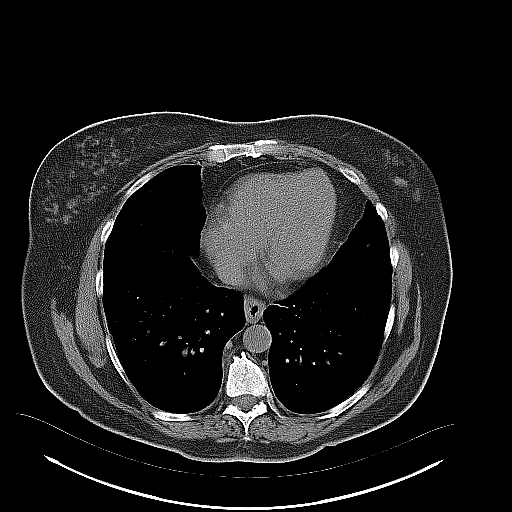
[im 21/22  soft-tissue]
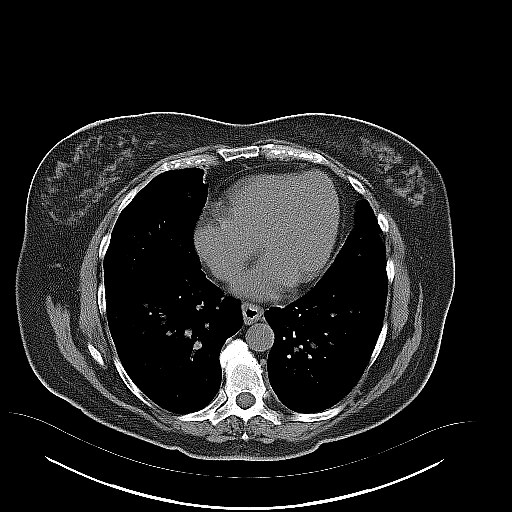

[Series 5: coronal · coronal · 0.91mm/px · 3 of 138 slices shown]
[im 46/138  soft-tissue]
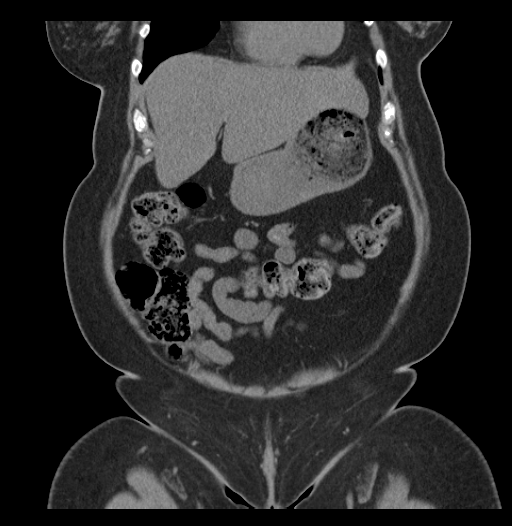
[im 61/138  soft-tissue]
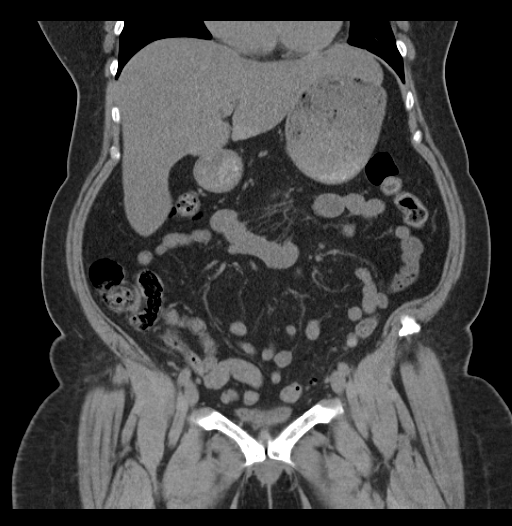
[im 77/138  soft-tissue]
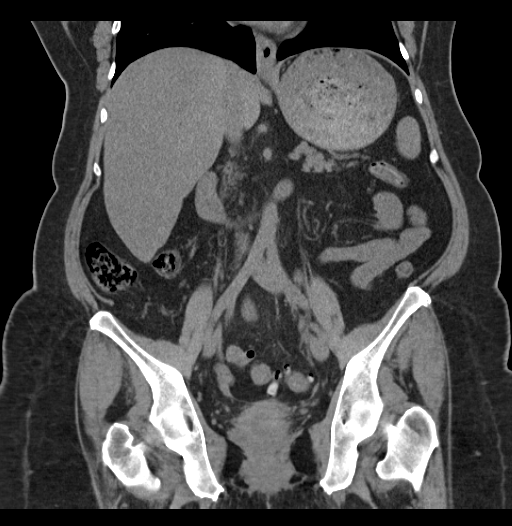

[17 of 36 positions shown; findings below may reference images not displayed]

FINDINGS: There is left hydroureteronephrosis due to obstruction by 6 mm stone
in the left ureterovesicular junction. There is no nephrolithiasis
bilaterally.

The patient is status post prior cholecystectomy. There is mild
fatty infiltration of liver. The spleen, pancreas, adrenal glands
are normal. The aorta is normal. There is no abdominal
lymphadenopathy. There is no small bowel obstruction or
diverticulitis. There is diverticulosis of colon. The appendix is
not seen but no inflammation is noted around cecum.

The bladder is decompressed. Uterine fibroid is identified.
Degenerative joint changes of the spine are noted.
IMPRESSION: Left hydroureteronephrosis due to obstruction by a 6 mm stone in the
left ureterovesicular junction.

## 2018-05-12 ENCOUNTER — Other Ambulatory Visit: Payer: Self-pay

## 2018-05-12 ENCOUNTER — Encounter (HOSPITAL_COMMUNITY): Payer: Self-pay | Admitting: Emergency Medicine

## 2018-05-12 ENCOUNTER — Emergency Department (HOSPITAL_COMMUNITY)
Admission: EM | Admit: 2018-05-12 | Discharge: 2018-05-12 | Disposition: A | Discharge status: Home / Self Care | Payer: Medicare Other | Attending: Emergency Medicine | Admitting: Emergency Medicine

## 2018-05-12 DIAGNOSIS — R55 Syncope and collapse: Secondary | ICD-10-CM | POA: Insufficient documentation

## 2018-05-12 DIAGNOSIS — Z7984 Long term (current) use of oral hypoglycemic drugs: Secondary | ICD-10-CM | POA: Insufficient documentation

## 2018-05-12 DIAGNOSIS — E119 Type 2 diabetes mellitus without complications: Secondary | ICD-10-CM | POA: Insufficient documentation

## 2018-05-12 DIAGNOSIS — Z79899 Other long term (current) drug therapy: Secondary | ICD-10-CM | POA: Insufficient documentation

## 2018-05-12 DIAGNOSIS — R197 Diarrhea, unspecified: Secondary | ICD-10-CM | POA: Diagnosis not present

## 2018-05-12 HISTORY — DX: Disorder of thyroid, unspecified: E07.9

## 2018-05-12 HISTORY — DX: Type 2 diabetes mellitus without complications: E11.9

## 2018-05-12 LAB — CBC WITH DIFFERENTIAL/PLATELET
Basophils Absolute: 0 10*3/uL (ref 0.0–0.1)
Basophils Relative: 0 %
Eosinophils Absolute: 0.1 10*3/uL (ref 0.0–0.7)
Eosinophils Relative: 1 %
HCT: 40.2 % (ref 36.0–46.0)
Hemoglobin: 13.4 g/dL (ref 12.0–15.0)
Lymphocytes Relative: 17 %
Lymphs Abs: 1.6 10*3/uL (ref 0.7–4.0)
MCH: 30.5 pg (ref 26.0–34.0)
MCHC: 33.3 g/dL (ref 30.0–36.0)
MCV: 91.4 fL (ref 78.0–100.0)
Monocytes Absolute: 0.5 10*3/uL (ref 0.1–1.0)
Monocytes Relative: 5 %
Neutro Abs: 7.2 10*3/uL (ref 1.7–7.7)
Neutrophils Relative %: 77 %
Platelets: 231 10*3/uL (ref 150–400)
RBC: 4.4 MIL/uL (ref 3.87–5.11)
RDW: 13.5 % (ref 11.5–15.5)
WBC: 9.4 10*3/uL (ref 4.0–10.5)

## 2018-05-12 LAB — COMPREHENSIVE METABOLIC PANEL
ALT: 24 U/L (ref 14–54)
AST: 24 U/L (ref 15–41)
Albumin: 3.7 g/dL (ref 3.5–5.0)
Alkaline Phosphatase: 102 U/L (ref 38–126)
Anion gap: 8 (ref 5–15)
BUN: 20 mg/dL (ref 6–20)
CO2: 24 mmol/L (ref 22–32)
Calcium: 8.4 mg/dL — ABNORMAL LOW (ref 8.9–10.3)
Chloride: 106 mmol/L (ref 101–111)
Creatinine, Ser: 0.68 mg/dL (ref 0.44–1.00)
GFR calc Af Amer: >60 mL/min (ref >60)
GFR calc non Af Amer: >60 mL/min (ref >60)
Glucose, Bld: 253 mg/dL — ABNORMAL HIGH (ref 65–99)
Potassium: 4 mmol/L (ref 3.5–5.1)
Sodium: 138 mmol/L (ref 135–145)
Total Bilirubin: 0.7 mg/dL (ref 0.3–1.2)
Total Protein: 7.1 g/dL (ref 6.5–8.1)

## 2018-05-12 LAB — URINALYSIS, ROUTINE W REFLEX MICROSCOPIC
Bilirubin Urine: NEGATIVE
Glucose, UA: 50 mg/dL — AB
Hgb urine dipstick: NEGATIVE
Ketones, ur: 5 mg/dL — AB
Leukocytes, UA: NEGATIVE
Nitrite: NEGATIVE
Protein, ur: NEGATIVE mg/dL
Specific Gravity, Urine: 1.015 (ref 1.005–1.030)
pH: 5 (ref 5.0–8.0)

## 2018-05-12 MED ORDER — SODIUM CHLORIDE 0.9 % IV BOLUS
1000.0000 mL | Freq: Once | INTRAVENOUS | Status: AC
  Administered 2018-05-12: 1000 mL via INTRAVENOUS

## 2018-05-12 NOTE — ED Notes (Signed)
Bed: WA21 Expected date:  Expected time:  Means of arrival:  Comments: EMS-vagal

## 2018-05-12 NOTE — ED Provider Notes (Signed)
[facility]-EMERGENCY DEPT Provider Note   CSN: [id number] Arrival date & time: 05/12/18  1204     History   Chief Complaint Chief Complaint  Patient presents with  . Loss of Consciousness    HPI Leah Carney is a 67 y.o. female with history of diabetes, thyroid disease who presents following vasovagal episode.  Patient reports she was on the toilet trying to make bowel movement, when she began feeling cool, clammy, lightheaded, pale.  She did not completely pass out.  She got up and called her husband who told her to call EMS.  On EMS arrival, patient very pale.  Patient reports she was having some mild abdominal cramping and feeling that she needed to make a bowel movement.  She strained and was able to pass a little bit.  She reports she is still having a little bit of feeling in her left lower quadrant that she needs to move her bowels.  She reports yesterday starting amoxicillin for an ear infection.  She was also told her PCP that her thyroid was low and was given medication which she was told to wait to begin taking.  She reports she has had a vasovagal episode before, but she was having allergic reaction at the same time.  Today patient denies any shortness of breath, rash, swelling of her lips, tongue, throat.  Patient is feeling better after EMS gave fluids.  Patient still feeling a little shaky.  Patient did need to be emergently seen which today.  She has no known allergies to this.  HPI  Past Medical History:  Diagnosis Date  . Diabetes mellitus without complication (HCC)   . Thyroid disease     There are no active problems to display for this patient.   Past Surgical History:  Procedure Laterality Date  . CHOLECYSTECTOMY    . fibroidectomy       OB History   None      Home Medications    Prior to Admission medications   Medication Sig Start Date End Date Taking? Authorizing Provider  amoxicillin (AMOXIL) 500 MG capsule Take 500 mg by  mouth 3 (three) times daily.   Yes [provider]  aspirin-acetaminophen-caffeine (EXCEDRIN MIGRAINE) [phone] MG tablet Take 2 tablets by mouth every 6 (six) hours as needed for headache or migraine.   Yes [provider]  diphenhydrAMINE (BENADRYL) 25 MG tablet Take 2 tablets (50 mg total) by mouth every 4 (four) hours as needed for itching. 12/14/14  Yes Arby Barrette, MD  EPINEPHrine (EPIPEN 2-PAK) 0.3 mg/0.3 mL IJ SOAJ injection Inject 0.3 mLs (0.3 mg total) into the muscle once. 12/14/14  Yes Arby Barrette, MD  metFORMIN (GLUCOPHAGE) 500 MG tablet Take 500 mg by mouth 2 (two) times daily with a meal.   Yes [provider]  pseudoephedrine (SUDAFED) 60 MG tablet Take 60 mg by mouth every 4 (four) hours as needed for congestion.   Yes [provider]  venlafaxine (EFFEXOR) 75 MG tablet Take 225 mg by mouth every morning.   Yes [provider]  famotidine (PEPCID) 20 MG tablet Take 1 tablet (20 mg total) by mouth 2 (two) times daily. Patient not taking: Reported on 05/12/2018 12/14/14   Arby Barrette, MD    Family History History reviewed. No pertinent family history.  Social History Social History   Tobacco Use  . Smoking status: Never Smoker  . Smokeless tobacco: Never Used  Substance Use Topics  . Alcohol use: No  .  Drug use: Not on file     Allergies   Other   Review of Systems Review of Systems  Constitutional: Positive for diaphoresis. Negative for chills and fever.  HENT: Negative for facial swelling and sore throat.   Respiratory: Negative for shortness of breath.   Cardiovascular: Negative for chest pain.  Gastrointestinal: Positive for nausea (resolved). Negative for abdominal pain, diarrhea and vomiting.  Genitourinary: Negative for dysuria.  Musculoskeletal: Negative for back pain.  Skin: Negative for rash and wound.  Neurological: Positive for syncope and light-headedness. Negative for headaches.    Psychiatric/Behavioral: The patient is not nervous/anxious.      Physical Exam Updated Vital Signs BP 108/80 (BP Location: Right Arm)   Pulse 79   Temp 98.2 F (36.8 C) (Oral)   Resp 20   Ht 5\' 5"  (1.651 m)   Wt 110.7 kg (244 lb)   SpO2 96%   BMI 40.60 kg/m   Physical Exam  Constitutional: She appears well-developed and well-nourished. No distress.  HENT:  Head: Normocephalic and atraumatic.  Right Ear: Tympanic membrane is erythematous (mild).  Mouth/Throat: Oropharynx is clear and moist. No oropharyngeal exudate.  L TM obstructed by cerumen  Eyes: Pupils are equal, round, and reactive to light. Conjunctivae are normal. Right eye exhibits no discharge. Left eye exhibits no discharge. No scleral icterus.  Neck: Normal range of motion. Neck supple. No thyromegaly present.  Cardiovascular: Normal rate, regular rhythm, normal heart sounds and intact distal pulses. Exam reveals no gallop and no friction rub.  No murmur heard. Pulmonary/Chest: Effort normal and breath sounds normal. No stridor. No respiratory distress. She has no wheezes. She has no rales.  Abdominal: Soft. Bowel sounds are normal. She exhibits no distension. There is no tenderness. There is no rebound and no guarding.  Musculoskeletal: She exhibits no edema.  Lymphadenopathy:    She has no cervical adenopathy.  Neurological: She is alert. Coordination normal.  Skin: Skin is warm and dry. No rash noted. She is not diaphoretic. No pallor.  Psychiatric: She has a normal mood and affect.  Nursing note and vitals reviewed.    ED Treatments / Results  Labs (all labs ordered are listed, but only abnormal results are displayed) Labs Reviewed  COMPREHENSIVE METABOLIC PANEL - Abnormal; Notable for the following components:      Result Value   Glucose, Bld 253 (*)    Calcium 8.4 (*)    All other components within normal limits  URINALYSIS, ROUTINE W REFLEX MICROSCOPIC - Abnormal; Notable for the following  components:   Glucose, UA 50 (*)    Ketones, ur 5 (*)    All other components within normal limits  CBC WITH DIFFERENTIAL/PLATELET    EKG EKG Interpretation  Date/Time:  Friday May 12 2018 12:22:35 EDT Ventricular Rate:  81 PR Interval:  150 QRS Duration: 84 QT Interval:  394 QTC Calculation: 457 R Axis:   8 Text Interpretation:  Normal sinus rhythm Normal ECG No significant change since last tracing Confirmed by Marily Memos [phone]) on 05/12/2018 1:37:35 PM   Radiology No results found.  Procedures Procedures (including critical care time)  Medications Ordered in ED Medications  sodium chloride 0.9 % bolus 1,000 mL (1,000 mLs Intravenous New Bag/Given 05/12/18 1319)     Initial Impression / Assessment and Plan / ED Course  I have reviewed the triage vital signs and the nursing notes.  Pertinent labs & imaging results that were available during my care of the patient were reviewed  by me and considered in my medical decision making (see chart for details).     Patient presenting following suspected vasovagal near syncope.  Patient was having a bowel movement and diarrhea when she felt pale and clammy.  Labs are unremarkable.  UA negative.  Patient given fluids and is feeling much better in the ED.  Orthostatics are stable.  Suspect diarrhea related to recent amoxicillin.  Patient only had one episode.  I would advise to continue that for otitis media.  Follow-up to PCP in 2 to 3 days.  Return precautions discussed.  Patient understands and agrees with plan.  Patient vitals stable throughout ED course and discharged in satisfactory condition. I discussed patient case with Dr. Clayborne DanaMesner who agrees with plan.   Final Clinical Impressions(s) / ED Diagnoses   Final diagnoses:  Vasovagal episode  Diarrhea, unspecified type    ED Discharge Orders    None       Emi HolesLaw, Denzil Bristol M, PA-C 05/12/18 1559    Mesner, Barbara CowerJason, MD 05/12/18 [phone]

## 2018-05-12 NOTE — Discharge Instructions (Signed)
Please follow-up with your doctor on Monday or Tuesday.  Make sure to stay well-hydrated.  Please return the emergency department if you develop any new or worsening symptoms.

## 2018-05-12 NOTE — ED Triage Notes (Signed)
Patient BIB [facility] from home for syncopal Vaso-Vagal episode. Wt was having a BM, passed out less than one minute. Medical illustratorire dept. Reports pt was pale, cool, and clammy on arrival. Pt a/o x4. Pt reports having a vagal episode several years ago that shortly led to some type of allergic reaction. Pt started taking Amoxicillin last night for ear infection.

## 2018-05-15 ENCOUNTER — Other Ambulatory Visit: Payer: Self-pay | Admitting: Family Medicine

## 2018-05-15 DIAGNOSIS — M8580 Other specified disorders of bone density and structure, unspecified site: Secondary | ICD-10-CM

## 2019-12-14 HISTORY — PX: CATARACT EXTRACTION: SUR2

## 2020-07-10 ENCOUNTER — Other Ambulatory Visit: Payer: Self-pay

## 2020-07-10 ENCOUNTER — Encounter: Payer: Self-pay | Admitting: Podiatry

## 2020-07-10 ENCOUNTER — Ambulatory Visit (INDEPENDENT_AMBULATORY_CARE_PROVIDER_SITE_OTHER): Payer: Medicare Other

## 2020-07-10 ENCOUNTER — Ambulatory Visit (INDEPENDENT_AMBULATORY_CARE_PROVIDER_SITE_OTHER): Payer: Medicare Other | Admitting: Podiatry

## 2020-07-10 DIAGNOSIS — M2042 Other hammer toe(s) (acquired), left foot: Secondary | ICD-10-CM

## 2020-07-10 DIAGNOSIS — M2041 Other hammer toe(s) (acquired), right foot: Secondary | ICD-10-CM

## 2020-07-10 NOTE — Progress Notes (Signed)
Subjective:   Patient ID: Leah Carney, female   DOB: 69 y.o.   MRN: [id number]   HPI Patient presents stating the second toe my left foot has become much more painful over the last few months and I am having trouble wearing shoe gear.  I's cannot wear closed in shoes and I tried to make some changes without success and patient states she does not smoke and likes to be active   Review of Systems  All other systems reviewed and are negative.       Objective:  Physical Exam Vitals and nursing note reviewed.  Constitutional:      Appearance: She is well-developed.  Pulmonary:     Effort: Pulmonary effort is normal.  Musculoskeletal:        General: Normal range of motion.  Skin:    General: Skin is warm.  Neurological:     Mental Status: She is alert.     Neurovascular status intact muscle strength found to be adequate range of motion is within normal limits.  Patient is found to have rigid contracture digit to left with elevation of the digit and pain with palpation.  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  I explained procedure risk and patient wants surgery understanding risk and today I allowed patient to read consent form going over digital fusion and possible complications along with alternative treatments.  Patient wants surgery signed consent form after review scheduled for outpatient surgery understands she will the pin for 5 weeks until recovery can take 6 months to 1 year.  Encouraged to call with questions concerns prior to procedure  X-rays indicated digital deformity digit to left over right with previous bunion correction which looks good from a number of years ago by me

## 2020-07-10 NOTE — Patient Instructions (Signed)
Hammer Toe  Hammer toe is a change in the shape (a deformity) of your toe. The deformity causes the middle joint of your toe to stay bent. This causes pain, especially when you are wearing shoes. Hammer toe starts gradually. At first, the toe can be straightened. Gradually over time, the deformity becomes stiff and permanent. Early treatments to keep the toe straight may relieve pain. As the deformity becomes stiff and permanent, surgery may be needed to straighten the toe. What are the causes? Hammer toe is caused by abnormal bending of the toe joint that is closest to your foot. It happens gradually over time. This pulls on the muscles and connections (tendons) of the toe joint, making them weak and stiff. It is often related to wearing shoes that are too short or narrow and do not let your toes straighten. What increases the risk? You may be at greater risk for hammer toe if you:  Are female.  Are older.  Wear shoes that are too small.  Wear high-heeled shoes that pinch your toes.  Are a ballet dancer.  Have a second toe that is longer than your big toe (first toe).  Injure your foot or toe.  Have arthritis.  Have a family history of hammer toe.  Have a nerve or muscle disorder. What are the signs or symptoms? The main symptoms of this condition are pain and deformity of the toe. The pain is worse when wearing shoes, walking, or running. Other symptoms may include:  Corns or calluses over the bent part of the toe or between the toes.  Redness and a burning feeling on the toe.  An open sore that forms on the top of the toe.  Not being able to straighten the toe. How is this diagnosed? This condition is diagnosed based on your symptoms and a physical exam. During the exam, your health care provider will try to straighten your toe to see how stiff the deformity is. You may also have tests, such as:  A blood test to check for rheumatoid arthritis.  An X-ray to show how  severe the deformity is. How is this treated? Treatment for this condition will depend on how stiff the deformity is. Surgery is often needed. However, sometimes a hammer toe can be straightened without surgery. Treatments that do not involve surgery include:  Taping the toe into a straightened position.  Using pads and cushions to protect the toe (orthotics).  Wearing shoes that provide enough room for the toes.  Doing toe-stretching exercises at home.  Taking an NSAID to reduce pain and swelling. If these treatments do not help or the toe cannot be straightened, surgery is the next option. The most common surgeries used to straighten a hammer toe include:  Arthroplasty. In this procedure, part of the joint is removed, and that allows the toe to straighten.  Fusion. In this procedure, cartilage between the two bones of the joint is taken out and the bones are fused together into one longer bone.  Implantation. In this procedure, part of the bone is removed and replaced with an implant to let the toe move again.  Flexor tendon transfer. In this procedure, the tendons that curl the toes down (flexor tendons) are repositioned. Follow these instructions at home:  Take over-the-counter and prescription medicines only as told by your health care provider.  Do toe straightening and stretching exercises as told by your health care provider.  Keep all follow-up visits as told by your health care   provider. This is important. How is this prevented?  Wear shoes that give your toes enough room and do not cause pain.  Do not wear high-heeled shoes. Contact a health care provider if:  Your pain gets worse.  Your toe becomes red or swollen.  You develop an open sore on your toe. This information is not intended to replace advice given to you by your health care provider. Make sure you discuss any questions you have with your health care provider. Document Revised: 11/11/2017 Document  Reviewed: 03/24/2016 Elsevier Patient Education  2020 [employer]  

## 2020-07-29 DIAGNOSIS — M2042 Other hammer toe(s) (acquired), left foot: Secondary | ICD-10-CM | POA: Diagnosis not present

## 2020-07-29 MED ORDER — HYDROCODONE-ACETAMINOPHEN 10-325 MG PO TABS: 1.0000 | ORAL_TABLET | Freq: Three times a day (TID) | ORAL | 0 refills | Status: AC | PRN

## 2020-07-29 NOTE — Addendum Note (Signed)
Addended by: Lenn Sink on: 07/29/2020 07:29 AM   Modules accepted: Orders

## 2020-08-06 ENCOUNTER — Ambulatory Visit (INDEPENDENT_AMBULATORY_CARE_PROVIDER_SITE_OTHER): Payer: Medicare Other | Admitting: Podiatry

## 2020-08-06 ENCOUNTER — Encounter: Payer: Self-pay | Admitting: Podiatry

## 2020-08-06 ENCOUNTER — Other Ambulatory Visit: Payer: Self-pay

## 2020-08-06 ENCOUNTER — Ambulatory Visit (INDEPENDENT_AMBULATORY_CARE_PROVIDER_SITE_OTHER): Payer: Medicare Other

## 2020-08-06 DIAGNOSIS — M2042 Other hammer toe(s) (acquired), left foot: Secondary | ICD-10-CM

## 2020-08-06 NOTE — Progress Notes (Signed)
Subjective:   Patient ID: Leah Carney, female   DOB: 69 y.o.   MRN: [id number]   HPI Patient presents stating I am doing great with the surgery with minimal discomfort   ROS      Objective:  Physical Exam  Neurovascular status intact negative Denna Haggard' sign noted left second digit doing well pin in place stitches in place     Assessment:  Doing well post fusion digit to left     Plan:  X-rays taken and reapplied sterile dressing and continue immobilization elevation reappoint 2 weeks suture removal  X-rays indicate pin is in place good alignment is noted

## 2020-08-20 ENCOUNTER — Ambulatory Visit (INDEPENDENT_AMBULATORY_CARE_PROVIDER_SITE_OTHER): Payer: Medicare Other

## 2020-08-20 ENCOUNTER — Other Ambulatory Visit: Payer: Self-pay

## 2020-08-20 ENCOUNTER — Ambulatory Visit (INDEPENDENT_AMBULATORY_CARE_PROVIDER_SITE_OTHER): Payer: Medicare Other | Admitting: Podiatry

## 2020-08-20 ENCOUNTER — Encounter: Payer: Self-pay | Admitting: Podiatry

## 2020-08-20 DIAGNOSIS — M2042 Other hammer toe(s) (acquired), left foot: Secondary | ICD-10-CM | POA: Diagnosis not present

## 2020-08-21 NOTE — Progress Notes (Signed)
Subjective:   Patient ID: Leah Carney, female   DOB: 69 y.o.   MRN: [id number]   HPI Patient states overall doing pretty well and just did bump my toe and wanted to make sure everything was okay   ROS      Objective:  Physical Exam  Neurovascular status intact negative Denna Haggard' sign noted second digit good alignment pin in place wound edges well coapted stitches in place     Assessment:  Well post digital fusion digit to left pin in place stitches in place     Plan:  H&P reviewed condition and stitches removed wound edges coapted well reviewed x-ray rebonded the toe in a plantarflexed position and reappoint 2 weeks pin removal or earlier if needed  X-rays indicate that the pin is in good alignment the toe is in good alignment and structurally looks sound

## 2020-09-02 ENCOUNTER — Telehealth: Payer: Self-pay | Admitting: Podiatry

## 2020-09-02 NOTE — Telephone Encounter (Signed)
Pt called and stated that she was in severe pain and swollen on sx toe. Pin is still in the toe. I scheduled patient for an appointment tomorrow with Dr. Allena Katz. What should she do in the mean time.

## 2020-09-02 NOTE — Telephone Encounter (Signed)
Elevate and take ibuprophen

## 2020-09-03 ENCOUNTER — Ambulatory Visit (INDEPENDENT_AMBULATORY_CARE_PROVIDER_SITE_OTHER): Payer: Medicare Other

## 2020-09-03 ENCOUNTER — Encounter: Payer: Self-pay | Admitting: Podiatry

## 2020-09-03 ENCOUNTER — Encounter: Payer: Medicare Other | Admitting: Podiatry

## 2020-09-03 ENCOUNTER — Ambulatory Visit (INDEPENDENT_AMBULATORY_CARE_PROVIDER_SITE_OTHER): Payer: Medicare Other | Admitting: Podiatry

## 2020-09-03 ENCOUNTER — Other Ambulatory Visit: Payer: Self-pay

## 2020-09-03 VITALS — Temp 98.6°F

## 2020-09-03 DIAGNOSIS — M2042 Other hammer toe(s) (acquired), left foot: Secondary | ICD-10-CM | POA: Diagnosis not present

## 2020-09-03 DIAGNOSIS — Z9889 Other specified postprocedural states: Secondary | ICD-10-CM

## 2020-09-03 MED ORDER — ACETAMINOPHEN-CODEINE #3 300-30 MG PO TABS: 1.0000 | ORAL_TABLET | ORAL | 0 refills | Status: DC | PRN

## 2020-09-03 MED ORDER — DOXYCYCLINE HYCLATE 100 MG PO TABS: 100.0000 mg | ORAL_TABLET | Freq: Two times a day (BID) | ORAL | 0 refills | Status: DC

## 2020-09-03 NOTE — Progress Notes (Signed)
Subjective:  Patient ID: Leah Carney, female    DOB: 24-May-1951,  MRN: [id number]  No chief complaint on file.    69 y.o. female returns for post-op check.  Patient states she is doing well.  She is does have some redness on the left second digit.  Patient states that is been causing her some pain as well with it.  She states it started Sunday night and came out of nowhere.  She had a hammertoe correction with pin still intact.  She denies any other acute complaints.  Review of Systems: Negative except as noted in the HPI. Denies N/V/F/Ch.  Past Medical History:  Diagnosis Date  . Diabetes mellitus without complication (HCC)   . Thyroid disease     Current Outpatient Medications:  .  acetaminophen-codeine (TYLENOL #3) 300-30 MG tablet, Take 1-2 tablets by mouth every 4 (four) hours as needed for moderate pain., Disp: 30 tablet, Rfl: 0 .  aspirin-acetaminophen-caffeine (EXCEDRIN MIGRAINE) 250-250-65 MG tablet, Take 2 tablets by mouth every 6 (six) hours as needed for headache or migraine., Disp: , Rfl:  .  cetirizine (ZYRTEC) 10 MG tablet, Take 10 mg by mouth daily., Disp: , Rfl:  .  diphenhydrAMINE (BENADRYL) 25 MG tablet, Take 2 tablets (50 mg total) by mouth every 4 (four) hours as needed for itching., Disp: 20 tablet, Rfl: 0 .  doxycycline (VIBRA-TABS) 100 MG tablet, Take 1 tablet (100 mg total) by mouth 2 (two) times daily., Disp: 20 tablet, Rfl: 0 .  EPINEPHrine (EPIPEN 2-PAK) 0.3 mg/0.3 mL IJ SOAJ injection, Inject 0.3 mLs (0.3 mg total) into the muscle once., Disp: 1 Device, Rfl: 1 .  levothyroxine (SYNTHROID) 100 MCG tablet, Take by mouth., Disp: , Rfl:  .  pseudoephedrine (SUDAFED) 60 MG tablet, Take 60 mg by mouth every 4 (four) hours as needed for congestion., Disp: , Rfl:  .  venlafaxine (EFFEXOR) 75 MG tablet, Take 225 mg by mouth every morning., Disp: , Rfl:   Social History   Tobacco Use  Smoking Status Never Smoker  Smokeless Tobacco Never Used    Allergies    Allergen Reactions  . Latex Hives  . Other     Patient has been prescribed an epipen, she has an unknown food allergy   Objective:  There were no vitals filed for this visit. There is no height or weight on file to calculate BMI. Constitutional Well developed. Well nourished.  Vascular Foot warm and well perfused. Capillary refill normal to all digits.   Neurologic Normal speech. Oriented to person, place, and time. Epicritic sensation to light touch grossly present bilaterally.  Dermatologic Skin healing well without signs of infection. Skin edges well coapted without signs of infection.  Mild erythema noted circumferentially around the digit.  Does not extend past the MPJ  Orthopedic: Tenderness to palpation noted about the surgical site.   Radiographs: 3 views of skeletally mature adult left foot: Pin is intact no signs of backing out or loosening noted.  No signs of osteomyelitis noted Assessment:   1. Hammertoe of left foot   2. Status post foot surgery    Plan:  Patient was evaluated and treated and all questions answered.  S/p foot surgery left -Progressing as expected post-operatively. -XR: See above -WB Status: Weightbearing as tolerated in surgical shoe -Sutures: None.  Pain was removed in standard technique followed by application Triple Antibiotic and a Band-Aid to the pin site -Medications: Tylenol 3 and doxycycline for skin and soft tissue prophylaxis and  pain control  No follow-ups on file.

## 2020-09-08 ENCOUNTER — Encounter: Payer: Medicare Other | Admitting: Podiatry

## 2020-09-18 ENCOUNTER — Encounter: Payer: Self-pay | Admitting: Podiatry

## 2020-09-18 ENCOUNTER — Other Ambulatory Visit: Payer: Self-pay

## 2020-09-18 ENCOUNTER — Ambulatory Visit (INDEPENDENT_AMBULATORY_CARE_PROVIDER_SITE_OTHER): Payer: Medicare Other

## 2020-09-18 ENCOUNTER — Ambulatory Visit (INDEPENDENT_AMBULATORY_CARE_PROVIDER_SITE_OTHER): Payer: Medicare Other | Admitting: Podiatry

## 2020-09-18 VITALS — Temp 98.2°F

## 2020-09-18 DIAGNOSIS — R52 Pain, unspecified: Secondary | ICD-10-CM

## 2020-09-20 NOTE — Progress Notes (Signed)
Subjective:   Patient ID: Leah Carney, female   DOB: 69 y.o.   MRN: [id number]   HPI Patient states toe is doing better and has completed antibiotics   ROS      Objective:  Physical Exam  Neurovascular status intact second toe has significant diminishment of swelling and erythema with good alignment noted and the distal pin tract healed well     Assessment:  Doing well post arthroplasty digit to left with fusion with possible postoperative infection that resolved based on clinical findings     Plan:  H&P x-ray reviewed and at this point patient is discharged and will be seen back as needed understanding that swelling may last for a relative significant period of time.  Patient will be seen back as needed  X-ray indicates good alignment with toe in good position and healing the current

## 2020-10-16 ENCOUNTER — Ambulatory Visit: Payer: Medicare Other | Admitting: Allergy and Immunology

## 2020-10-27 ENCOUNTER — Encounter: Payer: Self-pay | Admitting: Allergy and Immunology

## 2020-10-27 ENCOUNTER — Other Ambulatory Visit: Payer: Self-pay

## 2020-10-27 ENCOUNTER — Ambulatory Visit (INDEPENDENT_AMBULATORY_CARE_PROVIDER_SITE_OTHER): Payer: Medicare Other | Admitting: Allergy and Immunology

## 2020-10-27 VITALS — BP 124/74 | HR 87 | Resp 16 | Ht 64.5 in | Wt 231.0 lb

## 2020-10-27 DIAGNOSIS — J3089 Other allergic rhinitis: Secondary | ICD-10-CM | POA: Diagnosis not present

## 2020-10-27 DIAGNOSIS — T7800XA Anaphylactic reaction due to unspecified food, initial encounter: Secondary | ICD-10-CM

## 2020-10-27 MED ORDER — EPINEPHRINE 0.3 MG/0.3ML IJ SOAJ: INTRAMUSCULAR | 3 refills | Status: DC

## 2020-10-27 NOTE — Progress Notes (Signed)
[place] - [place] - [place] - [place] - [place]   Dear Dr. Bonnetta Barry ref. provider found,  Thank you for referring Leah Carney to the [facility] [place] on 10/27/2020.   Below is a summation of this patient's evaluation and recommendations.  Thank you for your referral. I will keep you informed about this patient's response to treatment.   If you have any questions please do not hesitate to contact me.   Sincerely,  Leah Priest, MD Allergy / Immunology [facility]   ______________________________________________________________________    NEW PATIENT NOTE  Referring Provider: No ref. provider found Primary Provider: Dois Davenport, MD Date of office visit: 10/27/2020    Subjective:   Chief Complaint:  Leah Carney (DOB: 12/30/1950) is a 69 y.o. female who presents to the clinic on 10/27/2020 with a chief complaint of Allergic Reaction .     HPI:  [name] presents to this clinic in evaluation of allergic reaction.  Apparently I have seen her in this clinic over 5 years ago for a similar type of allergic reaction with unknown etiology.  Both allergic reactions were very similar.  She develops acute onset diarrhea, facial and neck and anterior chest flushing, global itching, and swelling of her face and hands along with red eyes.  These reactions last about 90 minutes.    With her most recent reaction, which occurred 2 weeks ago, she took 3 Benadryl and went to the local fire station where she was evaluated by EMT without any specific therapy with resolution within 90 minutes.  Apparently her blood pressure was high with that evaluation.  With her most recent reaction this occurred while eating a beef jerky sandwich on rye bread.  This is the first time in 20 years that she has eating beef jerky.  She usually has a hamburger about twice a month and an occasional ham sandwich but  does not have any allergic reactions associated with that consumption.  She also eats rye bread without any problem.  She does take Claritin for allergic issues with her nose and eyes which works quite well.  Otherwise, she has no other associated atopic symptomatology.  She has not received the Covid vaccine as she is worried about the side effects of utilizing this vaccine.  She has not received the flu vaccine yet.  Past Medical History:  Diagnosis Date  . Diabetes mellitus without complication (HCC)   . Thyroid disease     Past Surgical History:  Procedure Laterality Date  . CATARACT EXTRACTION  12/2019  . CHOLECYSTECTOMY    . fibroidectomy    . TOE SURGERY Left     Allergies as of 10/27/2020      Reactions   Latex Hives   Other    Patient has been prescribed an epipen, she has an unknown food allergy      Medication List      acetaminophen-codeine 300-30 MG tablet Commonly known as: TYLENOL #3 Take 1-2 tablets by mouth every 4 (four) hours as needed for moderate pain.   aspirin-acetaminophen-caffeine 250-250-65 MG tablet Commonly known as: EXCEDRIN MIGRAINE Take 2 tablets by mouth every 6 (six) hours as needed for headache or migraine.   diphenhydrAMINE 25 MG tablet Commonly known as: Benadryl Take 2 tablets (50 mg total) by mouth every 4 (four) hours as needed for itching.   EPINEPHrine 0.3 mg/0.3 mL Soaj injection Commonly known as: EpiPen  2-Pak Inject 0.3 mLs (0.3 mg total) into the muscle once.   Januvia 100 MG tablet Generic drug: sitaGLIPtin Take 100 mg by mouth daily.   levothyroxine 88 MCG tablet Commonly known as: SYNTHROID Take 88 mcg by mouth daily before breakfast.   multivitamin tablet Take 1 tablet by mouth daily.   pseudoephedrine 60 MG tablet Commonly known as: SUDAFED Take 60 mg by mouth every 4 (four) hours as needed for congestion.   venlafaxine 75 MG tablet Commonly known as: EFFEXOR Take 225 mg by mouth every morning.        Review of systems negative except as noted in HPI / PMHx or noted below:  Review of Systems  Constitutional: Negative.   HENT: Negative.   Eyes: Negative.   Respiratory: Negative.   Cardiovascular: Negative.   Gastrointestinal: Negative.   Genitourinary: Negative.   Musculoskeletal: Negative.   Skin: Negative.   Neurological: Negative.   Endo/Heme/Allergies: Negative.   Psychiatric/Behavioral: Negative.     Family History  Problem Relation Age of Onset  . Diabetes Father   . Alcoholism Father   . Brain cancer Maternal Aunt   . Cancer Maternal Aunt        Colorectal cancer    Social History   Socioeconomic History  . Marital status: Married    Spouse name: Not on file  . Number of children: Not on file  . Years of education: Not on file  . Highest education level: Not on file  Occupational History  . Not on file  Tobacco Use  . Smoking status: Never Smoker  . Smokeless tobacco: Never Used  Substance and Sexual Activity  . Alcohol use: Yes    Comment: Seldom  . Drug use: Never  . Sexual activity: Not on file  Other Topics Concern  . Not on file  Social History Narrative  . Not on file    Environmental and Social history  Lives in a house with a dry environment, cat and dog located inside the household, carpet in the bedroom, no plastic on the bed, no plastic on the pillow, and no smoking ongoing with inside the household.  Objective:   Vitals:   10/27/20 0834  BP: 124/74  Pulse: 87  Resp: 16  SpO2: 97%   Height: 5' 4.5" (163.8 cm) Weight: 231 lb (104.8 kg)  Physical Exam Constitutional:      Appearance: She is not diaphoretic.  HENT:     Head: Normocephalic.     Right Ear: Tympanic membrane, ear canal and external ear normal.     Left Ear: Tympanic membrane, ear canal and external ear normal.     Nose: Nose normal. No mucosal edema or rhinorrhea.     Mouth/Throat:     Pharynx: Uvula midline. No oropharyngeal exudate.  Eyes:      Conjunctiva/sclera: Conjunctivae normal.  Neck:     Thyroid: No thyromegaly.     Trachea: Trachea normal. No tracheal tenderness or tracheal deviation.  Cardiovascular:     Rate and Rhythm: Normal rate and regular rhythm.     Heart sounds: Normal heart sounds, S1 normal and S2 normal. No murmur heard.   Pulmonary:     Effort: No respiratory distress.     Breath sounds: Normal breath sounds. No stridor. No wheezing or rales.  Lymphadenopathy:     Head:     Right side of head: No tonsillar adenopathy.     Left side of head: No tonsillar adenopathy.  Cervical: No cervical adenopathy.  Skin:    Findings: No erythema or rash.     Nails: There is no clubbing.  Neurological:     Mental Status: She is alert.     Diagnostics: Allergy skin tests were performed.  She demonstrated slight hypersensitivity to rye.  She did not demonstrate any hypersensitivity to mammal meat.  Assessment and Plan:    1. Allergy with anaphylaxis due to food   2. Perennial allergic rhinitis     1.  Allergen avoidance measures?  2.  EpiPen, Benadryl, MD/ER evaluation for allergic reaction  3.  Can continue Claritin 10 mg tablet daily for nasal allergies  4.  Blood -alpha gal panel, rye IgE  5.  Further evaluation and treatment?  Yes, if recurrent allergic reactions  6.  Consider obtaining COVID vaccine and flu vaccine  7.  Monoclonal antibody infusion if infected with Covid  It is not entirely clear why Raynelle Fanning had an allergic reaction recently after eating beef jerky and rye but we will further explore this issue with the blood test noted above.  She has eaten rye bread multiple times since that event with no difficulty but she has remained away from mammal consumption at this point.  It is quite possible that her allergic reaction was a manifestation of using Januvia which is associated with rare intermittent episodes of urticaria and angioedema.  I did have a talk with her today about obtaining a  Covid vaccine and flu vaccine and also made the recommendation that if she gets infected with Covid she should immediately get a monoclonal anti-RBD antibody infusion.  Leah Priest, MD Allergy / Immunology [facility] [place]

## 2020-10-27 NOTE — Patient Instructions (Addendum)
  1.  Allergen avoidance measures?  2.  EpiPen, Benadryl, MD/ER evaluation for allergic reaction  3.  Can continue Claritin 10 mg tablet daily for nasal allergies  4.  Blood -alpha gal panel, rye IgE  5.  Further evaluation and treatment?  Yes, if recurrent allergic reactions  6.  Consider obtaining COVID vaccine and flu vaccine  7.  Monoclonal antibody infusion if infected with Covid

## 2020-10-28 ENCOUNTER — Encounter: Payer: Self-pay | Admitting: Allergy and Immunology

## 2020-11-04 LAB — ALPHA-GAL PANEL
Alpha Gal IgE*: <0.1 kU/L (ref <0.10)
Beef (Bos spp) IgE: <0.1 kU/L (ref <0.35)
Class Interpretation: 0
Class Interpretation: 0
Class Interpretation: 0
Lamb/Mutton (Ovis spp) IgE: <0.1 kU/L (ref <0.35)
Pork (Sus spp) IgE: <0.1 kU/L (ref <0.35)

## 2020-11-04 LAB — ALLERGEN, RYE, F5: Rye IgE: <0.1 kU/L

## 2021-01-15 ENCOUNTER — Encounter (HOSPITAL_COMMUNITY): Payer: Self-pay

## 2021-01-15 ENCOUNTER — Emergency Department (HOSPITAL_COMMUNITY)
Admission: EM | Admit: 2021-01-15 | Discharge: 2021-01-15 | Disposition: A | Discharge status: Home / Self Care | Payer: Medicare Other | Attending: Emergency Medicine | Admitting: Emergency Medicine

## 2021-01-15 DIAGNOSIS — T7840XA Allergy, unspecified, initial encounter: Secondary | ICD-10-CM | POA: Insufficient documentation

## 2021-01-15 DIAGNOSIS — Z7984 Long term (current) use of oral hypoglycemic drugs: Secondary | ICD-10-CM | POA: Insufficient documentation

## 2021-01-15 DIAGNOSIS — Z91040 Latex allergy status: Secondary | ICD-10-CM | POA: Diagnosis not present

## 2021-01-15 DIAGNOSIS — E119 Type 2 diabetes mellitus without complications: Secondary | ICD-10-CM | POA: Insufficient documentation

## 2021-01-15 MED ORDER — ONDANSETRON HCL 4 MG/2ML IJ SOLN
4.0000 mg | Freq: Once | INTRAMUSCULAR | Status: AC
  Administered 2021-01-15: 4 mg via INTRAVENOUS
  Filled 2021-01-15: qty 2

## 2021-01-15 MED ORDER — FAMOTIDINE IN NACL 20-0.9 MG/50ML-% IV SOLN
20.0000 mg | Freq: Once | INTRAVENOUS | Status: AC
  Administered 2021-01-15: 20 mg via INTRAVENOUS
  Filled 2021-01-15: qty 50

## 2021-01-15 MED ORDER — PREDNISONE 20 MG PO TABS: ORAL_TABLET | ORAL | 0 refills | Status: DC

## 2021-01-15 MED ORDER — METHYLPREDNISOLONE SODIUM SUCC 125 MG IJ SOLR
125.0000 mg | Freq: Once | INTRAMUSCULAR | Status: AC
  Administered 2021-01-15: 125 mg via INTRAVENOUS
  Filled 2021-01-15: qty 2

## 2021-01-15 NOTE — ED Provider Notes (Signed)
[facility]-EMERGENCY DEPT Provider Note   CSN: [id number] Arrival date & time: 01/15/21  0139     History No chief complaint on file.   Leah Carney is a 70 y.o. female.  The history is provided by the patient.  Allergic Reaction Presenting symptoms: itching   Presenting symptoms comment:  Redness of the skin diffiusely  Itching:    Location:  Full body   Severity:  Moderate   Onset quality:  Gradual   Duration:  1 hour   Timing:  Constant   Progression:  Unchanged Severity:  Moderate Duration:  1 hour Prior allergic episodes:  Unable to specify Context: not animal exposure, not food allergies and not medications   Relieved by:  Nothing Worsened by:  Nothing Ineffective treatments:  Antihistamines (took 2 benadryl PTA and redness is resolved ) Multiple previous reactions but patient reports her allergist is unable to find the source of the episodes.  No lip or throat or tongue swelling.  Patient has tingling of the lips and hands and tremors but no swelling.       Past Medical History:  Diagnosis Date  . Diabetes mellitus without complication (HCC)   . Thyroid disease     There are no problems to display for this patient.   Past Surgical History:  Procedure Laterality Date  . CATARACT EXTRACTION  12/2019  . CHOLECYSTECTOMY    . fibroidectomy    . TOE SURGERY Left      OB History   No obstetric history on file.     Family History  Problem Relation Age of Onset  . Diabetes Father   . Alcoholism Father   . Brain cancer Maternal Aunt   . Cancer Maternal Aunt        Colorectal cancer    Social History   Tobacco Use  . Smoking status: Never Smoker  . Smokeless tobacco: Never Used  Substance Use Topics  . Alcohol use: Yes    Comment: Seldom  . Drug use: Never    Home Medications Prior to Admission medications   Medication Sig Start Date End Date Taking? Authorizing Provider  acetaminophen-codeine (TYLENOL #3) 300-30 MG  tablet Take 1-2 tablets by mouth every 4 (four) hours as needed for moderate pain. 09/03/20   Candelaria Stagers, DPM  aspirin-acetaminophen-caffeine (EXCEDRIN MIGRAINE) [phone] MG tablet Take 2 tablets by mouth every 6 (six) hours as needed for headache or migraine.    [provider]  diphenhydrAMINE (BENADRYL) 25 MG tablet Take 2 tablets (50 mg total) by mouth every 4 (four) hours as needed for itching. 12/14/14   Arby Barrette, MD  EPINEPHrine (EPIPEN 2-PAK) 0.3 mg/0.3 mL IJ SOAJ injection Use as directed for life-threatening allergic reaction. 10/27/20   Kozlow, Alvira Philips, MD  JANUVIA 100 MG tablet Take 100 mg by mouth daily. 09/13/20   [provider]  levothyroxine (SYNTHROID) 88 MCG tablet Take 88 mcg by mouth daily before breakfast.    [provider]  Multiple Vitamin (MULTIVITAMIN) tablet Take 1 tablet by mouth daily.    [provider]  pseudoephedrine (SUDAFED) 60 MG tablet Take 60 mg by mouth every 4 (four) hours as needed for congestion.    [provider]  venlafaxine (EFFEXOR) 75 MG tablet Take 225 mg by mouth every morning.    [provider]    Allergies    Latex and Other  Review of Systems   Review of Systems  Constitutional: Negative for fever.  HENT: Negative for congestion and facial swelling.   Eyes: Negative for visual disturbance.  Respiratory: Negative for shortness of breath.   Cardiovascular: Negative for chest pain.  Gastrointestinal: Negative for abdominal pain.  Genitourinary: Negative for difficulty urinating.  Skin: Positive for color change and itching.  Neurological: Negative for dizziness.  Psychiatric/Behavioral: Negative for agitation. The patient is nervous/anxious.   All other systems reviewed and are negative.   Physical Exam Updated Vital Signs BP (!) 129/98 (BP Location: Left Arm)   Pulse (!) 105   Temp 98.6 F (37 C) (Oral)   Resp 16   Ht 5\' 5"  (1.651 m)   Wt 97.5 kg   SpO2 96%   BMI  35.78 kg/m   Physical Exam Vitals and nursing note reviewed.  Constitutional:      General: She is not in acute distress.    Appearance: Normal appearance.  HENT:     Head: Normocephalic and atraumatic.     Nose: Nose normal.     Mouth/Throat:     Comments: No swelling of the lips tongue or uvula  Eyes:     Conjunctiva/sclera: Conjunctivae normal.     Pupils: Pupils are equal, round, and reactive to light.  Cardiovascular:     Rate and Rhythm: Normal rate and regular rhythm.     Pulses: Normal pulses.     Heart sounds: Normal heart sounds.  Pulmonary:     Effort: Pulmonary effort is normal. No respiratory distress.     Breath sounds: Normal breath sounds. No stridor. No wheezing or rales.  Abdominal:     General: Abdomen is flat.     Tenderness: There is no abdominal tenderness. There is no guarding.  Musculoskeletal:        General: Normal range of motion.     Cervical back: Normal range of motion and neck supple.  Skin:    General: Skin is dry.     Capillary Refill: Capillary refill takes less than 2 seconds.     Findings: No rash.  Neurological:     General: No focal deficit present.     Mental Status: She is alert and oriented to person, place, and time.     Deep Tendon Reflexes: Reflexes normal.  Psychiatric:        Mood and Affect: Mood is anxious.     ED Results / Procedures / Treatments   Labs (all labs ordered are listed, but only abnormal results are displayed) Labs Reviewed - No data to display  EKG None  Radiology No results found.  Procedures Procedures   Medications Ordered in ED Medications  methylPREDNISolone sodium succinate (SOLU-MEDROL) 125 mg/2 mL injection 125 mg (125 mg Intravenous Given 01/15/21 0216)  famotidine (PEPCID) IVPB 20 mg premix (0 mg Intravenous Stopped 01/15/21 0252)  ondansetron (ZOFRAN) injection 4 mg (4 mg Intravenous Given 01/15/21 0234)    ED Course  I have reviewed the triage vital signs and the nursing  notes.  Pertinent labs & imaging results that were available during my care of the patient were reviewed by me and considered in my medical decision making (see chart for details).   Patient took benadryl PTA.  I see no rash.  There is no swelling. I suspect this is more anxiety given the symptoms and patient's demeanor.  I have treated the patient.  Patient is stable for discharge with close follow up.  Strict return precautions given.     03/15/21 was evaluated in Emergency Department  on 01/15/2021 for the symptoms described in the history of present illness. She was evaluated in the context of the global COVID-19 pandemic, which necessitated consideration that the patient might be at risk for infection with the SARS-CoV-2 virus that causes COVID-19. Institutional protocols and algorithms that pertain to the evaluation of patients at risk for COVID-19 are in a state of rapid change based on information released by regulatory bodies including the CDC and federal and state organizations. These policies and algorithms were followed during the patient's care in the ED.  Final Clinical Impression(s) / ED Diagnoses Return for intractable cough, coughing up blood, fevers >100.4 unrelieved by medication, shortness of breath, intractable vomiting, chest pain, shortness of breath, weakness, numbness, changes in speech, facial asymmetry, abdominal pain, passing out, Inability to tolerate liquids or food, cough, altered mental status or any concerns. No signs of systemic illness or infection. The patient is nontoxic-appearing on exam and vital signs are within normal limits.  I have reviewed the triage vital signs and the nursing notes. Pertinent labs & imaging results that were available during my care of the patient were reviewed by me and considered in my medical decision making (see chart for details). After history, exam, and medical workup I feel the patient has been appropriately medically screened  and is safe for discharge home. Pertinent diagnoses were discussed with the patient. Patient was given return precautions.      Marcellene Shivley, MD 01/15/21 [phone]

## 2021-01-15 NOTE — ED Triage Notes (Signed)
Pt complains of an allergic reaction to something, she states that it's happened before and never found out what it was She states that she's red and itchy, her lips and tongue are tingly and she had slight tremors

## 2021-01-26 ENCOUNTER — Encounter: Payer: Self-pay | Admitting: Allergy and Immunology

## 2021-01-26 ENCOUNTER — Ambulatory Visit (INDEPENDENT_AMBULATORY_CARE_PROVIDER_SITE_OTHER): Payer: Medicare Other | Admitting: Allergy and Immunology

## 2021-01-26 ENCOUNTER — Other Ambulatory Visit: Payer: Self-pay

## 2021-01-26 ENCOUNTER — Other Ambulatory Visit: Payer: Self-pay | Admitting: Allergy and Immunology

## 2021-01-26 VITALS — BP 124/70 | HR 80 | Resp 20

## 2021-01-26 DIAGNOSIS — J3089 Other allergic rhinitis: Secondary | ICD-10-CM

## 2021-01-26 DIAGNOSIS — T7800XD Anaphylactic reaction due to unspecified food, subsequent encounter: Secondary | ICD-10-CM | POA: Diagnosis not present

## 2021-01-26 DIAGNOSIS — T782XXD Anaphylactic shock, unspecified, subsequent encounter: Secondary | ICD-10-CM

## 2021-01-26 DIAGNOSIS — T7840XD Allergy, unspecified, subsequent encounter: Secondary | ICD-10-CM

## 2021-01-26 MED ORDER — EPINEPHRINE 0.3 MG/0.3ML IJ SOAJ: INTRAMUSCULAR | 3 refills | Status: AC

## 2021-01-26 NOTE — Progress Notes (Unsigned)
[place] [facility] [place] [facility] [place] [facility] [place] [facility] [place] [facility]   Follow-up Note  Referring Provider: Dois Davenport, MD Primary Provider: Dois Davenport, MD Date of Office Visit: 01/26/2021  Subjective:   Leah Carney (DOB: 18-Jul-1951) is a 70 y.o. female who [facility] in re-evaluation of the following:  HPI: Leah Carney presents to this clinic in evaluation of recurrent allergic reactions.  Her last visit to this clinic was her initial evaluation of 27 October 2020.  She had another allergic reaction about 10 days ago at which point in time she woke up at 1:30 in the morning with palm itching and facial redness and then body redness and lip and tongue tingling and difficulty getting a deep breath for which she went to the emergency room and was treated with various medications and that issue resolved within an hour or 2.  She did not have any associated diarrhea like she did with her other allergic reactions.  She had no other associated systemic or constitutional symptoms.  There was no obvious provoking factor giving rise to this issue.  She did not use any type of nonsteroidal anti-inflammatory drug or eat any unusual food or take a supplement or have any unusual environmental exposure.  This apparently occurred when she was taking a Claritin every day.  She has since switched to Zyrtec.  She stopped Januvia about 4 weeks ago.  She has been swabbing the inside of her nasal passages with Listerine as an antiseptic against infectious diseases over the course of the past week or so.  Allergies as of 01/26/2021      Reactions   Latex Hives   Other    Patient has been prescribed an epipen, she has an unknown food allergy      Medication List    aspirin-acetaminophen-caffeine 250-250-65 MG tablet Commonly known as: EXCEDRIN MIGRAINE Take 2 tablets by mouth every 6 (six) hours as needed for headache or migraine.   diphenhydrAMINE 25 MG  tablet Commonly known as: Benadryl Take 2 tablets (50 mg total) by mouth every 4 (four) hours as needed for itching.   EPINEPHrine 0.3 mg/0.3 mL Soaj injection Commonly known as: EpiPen 2-Pak Use as directed for life-threatening allergic reaction.   levothyroxine 88 MCG tablet Commonly known as: SYNTHROID Take 88 mcg by mouth daily before breakfast.   multivitamin tablet Take 1 tablet by mouth daily.   pseudoephedrine 60 MG tablet Commonly known as: SUDAFED Take 60 mg by mouth every 4 (four) hours as needed for congestion.   venlafaxine 75 MG tablet Commonly known as: EFFEXOR Take 225 mg by mouth every morning.       Past Medical History:  Diagnosis Date  . Diabetes mellitus without complication (HCC)   . Thyroid disease     Past Surgical History:  Procedure Laterality Date  . CATARACT EXTRACTION  12/2019  . CHOLECYSTECTOMY    . fibroidectomy    . TOE SURGERY Left     Review of systems negative except as noted in HPI / PMHx or noted below:  Review of Systems  Constitutional: Negative.   HENT: Negative.   Eyes: Negative.   Respiratory: Negative.   Cardiovascular: Negative.   Gastrointestinal: Negative.   Genitourinary: Negative.   Musculoskeletal: Negative.   Skin: Negative.   Neurological: Negative.   Endo/Heme/Allergies: Negative.   Psychiatric/Behavioral: Negative.      Objective:   Vitals:   01/26/21 1557  BP: 124/70  Pulse: 80  Resp: 20  SpO2: 96%          Physical Exam Constitutional:      Appearance: She is not diaphoretic.  HENT:     Head: Normocephalic.     Right Ear: Tympanic membrane, ear canal and external ear normal.     Left Ear: Tympanic membrane, ear canal and external ear normal.     Nose: Nose normal. No mucosal edema (Excoriated slightly bloody septal mucosa right) or rhinorrhea.     Mouth/Throat:     Mouth: Oropharynx is clear and moist and mucous membranes are normal.     Pharynx: Uvula midline. No oropharyngeal  exudate.  Eyes:     Conjunctiva/sclera: Conjunctivae normal.  Neck:     Thyroid: No thyromegaly.     Trachea: Trachea normal. No tracheal tenderness or tracheal deviation.  Cardiovascular:     Rate and Rhythm: Normal rate and regular rhythm.     Heart sounds: Normal heart sounds, S1 normal and S2 normal. No murmur heard.   Pulmonary:     Effort: No respiratory distress.     Breath sounds: Normal breath sounds. No stridor. No wheezing or rales.  Musculoskeletal:        General: No edema.  Lymphadenopathy:     Head:     Right side of head: No tonsillar adenopathy.     Left side of head: No tonsillar adenopathy.     Cervical: No cervical adenopathy.  Skin:    Findings: No erythema or rash.     Nails: There is no clubbing.  Neurological:     Mental Status: She is alert.     Diagnostics:   Results of blood tests obtained 27 October 2020 identified negative rye IgE, negative alpha gal IgE panel  Assessment and Plan:   1. Anaphylaxis, subsequent encounter   2. Allergic reaction, subsequent encounter   3. Perennial allergic rhinitis     1.  Allergen avoidance measures?  2.  EpiPen, Benadryl, MD/ER evaluation for allergic reaction  3.  Continue Cetirizine 10 mg daily   4.  Blood - CBC w/d, CMP, TSH, FT4, Thyroid peroxidase, tryptase  5.  Further evaluation and treatment?  Yes, if recurrent allergic reactions  6.  Stop swabbing inside of nose with Listerine  We will take another step of evaluation in an attempt to identify the etiologic agent responsible for Leah Carney's recurrent allergic reactions.  We will make a general screening of her major organ function and also look for immunological hyperreactivity associated with thyroiditis and also check a tryptase for mast cell activation/dysregulation.  I will contact her with the results of these blood test once they are available for review.  If she does well using cetirizine on a daily basis and does not have any allergic  reactions that would be wonderful.  If she has recurrent allergic reactions then she is going to require further evaluation and treatment.  As well, she is irritating her nasal airway by using Listerine in her nose and will get her to stop that application.  Laurette Schimke, MD Allergy / Immunology [facility]

## 2021-01-26 NOTE — Patient Instructions (Addendum)
  1.  Allergen avoidance measures?  2.  EpiPen, Benadryl, MD/ER evaluation for allergic reaction  3.  Continue Cetirizine 10 mg daily   4.  Blood - CBC w/d, CMP, TSH, FT4, Thyroid peroxidase, tryptase  5.  Further evaluation and treatment?  Yes, if recurrent allergic reactions  6.  Stop swabbing inside of nose with Listerine.

## 2021-01-27 ENCOUNTER — Encounter: Payer: Self-pay | Admitting: Allergy and Immunology

## 2021-01-28 LAB — CBC WITH DIFFERENTIAL/PLATELET
Basophils Absolute: 0.1 10*3/uL (ref 0.0–0.2)
Basos: 1 %
EOS (ABSOLUTE): 0.2 10*3/uL (ref 0.0–0.4)
Eos: 2 %
Hematocrit: 42.9 % (ref 34.0–46.6)
Hemoglobin: 14.6 g/dL (ref 11.1–15.9)
Immature Grans (Abs): 0 10*3/uL (ref 0.0–0.1)
Immature Granulocytes: 0 %
Lymphocytes Absolute: 3 10*3/uL (ref 0.7–3.1)
Lymphs: 38 %
MCH: 30.2 pg (ref 26.6–33.0)
MCHC: 34 g/dL (ref 31.5–35.7)
MCV: 89 fL (ref 79–97)
Monocytes Absolute: 0.5 10*3/uL (ref 0.1–0.9)
Monocytes: 6 %
Neutrophils Absolute: 4.2 10*3/uL (ref 1.4–7.0)
Neutrophils: 53 %
Platelets: 309 10*3/uL (ref 150–450)
RBC: 4.83 x10E6/uL (ref 3.77–5.28)
RDW: 12.8 % (ref 11.7–15.4)
WBC: 8 10*3/uL (ref 3.4–10.8)

## 2021-01-28 LAB — COMPREHENSIVE METABOLIC PANEL
ALT: 16 IU/L (ref 0–32)
AST: 14 IU/L (ref 0–40)
Albumin/Globulin Ratio: 1.5 (ref 1.2–2.2)
Albumin: 4.2 g/dL (ref 3.8–4.8)
Alkaline Phosphatase: 116 IU/L (ref 44–121)
BUN/Creatinine Ratio: 30 — ABNORMAL HIGH (ref 12–28)
BUN: 19 mg/dL (ref 8–27)
Bilirubin Total: 0.4 mg/dL (ref 0.0–1.2)
CO2: 24 mmol/L (ref 20–29)
Calcium: 9.2 mg/dL (ref 8.7–10.3)
Chloride: 103 mmol/L (ref 96–106)
Creatinine, Ser: 0.63 mg/dL (ref 0.57–1.00)
GFR calc Af Amer: 107 mL/min/{1.73_m2} (ref >59)
GFR calc non Af Amer: 92 mL/min/{1.73_m2} (ref >59)
Globulin, Total: 2.8 g/dL (ref 1.5–4.5)
Glucose: 197 mg/dL — ABNORMAL HIGH (ref 65–99)
Potassium: 4.7 mmol/L (ref 3.5–5.2)
Sodium: 138 mmol/L (ref 134–144)
Total Protein: 7 g/dL (ref 6.0–8.5)

## 2021-01-28 LAB — THYROID PEROXIDASE ANTIBODY: Thyroperoxidase Ab SerPl-aCnc: 18 IU/mL (ref 0–34)

## 2021-01-28 LAB — TSH+FREE T4
Free T4: 1.16 ng/dL (ref 0.82–1.77)
TSH: 2.95 u[IU]/mL (ref 0.450–4.500)

## 2021-01-28 LAB — TRYPTASE: Tryptase: 6.7 ug/L (ref 2.2–13.2)

## 2021-02-02 ENCOUNTER — Other Ambulatory Visit: Payer: Self-pay | Admitting: *Deleted

## 2021-02-02 MED ORDER — MONTELUKAST SODIUM 10 MG PO TABS
10.0000 mg | ORAL_TABLET | Freq: Every day | ORAL | 5 refills | Status: DC
Start: 2021-02-02 — End: 2023-02-25

## 2023-02-25 ENCOUNTER — Emergency Department (HOSPITAL_COMMUNITY)
Admission: EM | Admit: 2023-02-25 | Discharge: 2023-02-25 | Disposition: A | Discharge status: Home / Self Care | Payer: Medicare Other | Attending: Emergency Medicine | Admitting: Emergency Medicine

## 2023-02-25 ENCOUNTER — Encounter (HOSPITAL_COMMUNITY): Payer: Self-pay

## 2023-02-25 DIAGNOSIS — R112 Nausea with vomiting, unspecified: Secondary | ICD-10-CM | POA: Diagnosis not present

## 2023-02-25 DIAGNOSIS — E860 Dehydration: Secondary | ICD-10-CM

## 2023-02-25 DIAGNOSIS — E119 Type 2 diabetes mellitus without complications: Secondary | ICD-10-CM | POA: Diagnosis not present

## 2023-02-25 DIAGNOSIS — Z91040 Latex allergy status: Secondary | ICD-10-CM | POA: Insufficient documentation

## 2023-02-25 DIAGNOSIS — Z7982 Long term (current) use of aspirin: Secondary | ICD-10-CM | POA: Diagnosis not present

## 2023-02-25 DIAGNOSIS — D72829 Elevated white blood cell count, unspecified: Secondary | ICD-10-CM | POA: Insufficient documentation

## 2023-02-25 DIAGNOSIS — R1084 Generalized abdominal pain: Secondary | ICD-10-CM | POA: Diagnosis not present

## 2023-02-25 DIAGNOSIS — R109 Unspecified abdominal pain: Secondary | ICD-10-CM | POA: Diagnosis present

## 2023-02-25 DIAGNOSIS — Z79899 Other long term (current) drug therapy: Secondary | ICD-10-CM | POA: Diagnosis not present

## 2023-02-25 DIAGNOSIS — R197 Diarrhea, unspecified: Secondary | ICD-10-CM | POA: Diagnosis not present

## 2023-02-25 LAB — COMPREHENSIVE METABOLIC PANEL
ALT: 18 U/L (ref 0–44)
AST: 22 U/L (ref 15–41)
Albumin: 4.7 g/dL (ref 3.5–5.0)
Alkaline Phosphatase: 97 U/L (ref 38–126)
Anion gap: 10 (ref 5–15)
BUN: 25 mg/dL — ABNORMAL HIGH (ref 8–23)
CO2: 23 mmol/L (ref 22–32)
Calcium: 9.2 mg/dL (ref 8.9–10.3)
Chloride: 105 mmol/L (ref 98–111)
Creatinine, Ser: 0.76 mg/dL (ref 0.44–1.00)
GFR, Estimated: >60 mL/min (ref >60)
Glucose, Bld: 173 mg/dL — ABNORMAL HIGH (ref 70–99)
Potassium: 3.6 mmol/L (ref 3.5–5.1)
Sodium: 138 mmol/L (ref 135–145)
Total Bilirubin: 0.9 mg/dL (ref 0.3–1.2)
Total Protein: 8.3 g/dL — ABNORMAL HIGH (ref 6.5–8.1)

## 2023-02-25 LAB — CBC WITH DIFFERENTIAL/PLATELET
Abs Immature Granulocytes: 0.04 10*3/uL (ref 0.00–0.07)
Basophils Absolute: 0 10*3/uL (ref 0.0–0.1)
Basophils Relative: 0 %
Eosinophils Absolute: 0.1 10*3/uL (ref 0.0–0.5)
Eosinophils Relative: 1 %
HCT: 45.2 % (ref 36.0–46.0)
Hemoglobin: 14.6 g/dL (ref 12.0–15.0)
Immature Granulocytes: 0 %
Lymphocytes Relative: 14 %
Lymphs Abs: 1.7 10*3/uL (ref 0.7–4.0)
MCH: 30.1 pg (ref 26.0–34.0)
MCHC: 32.3 g/dL (ref 30.0–36.0)
MCV: 93.2 fL (ref 80.0–100.0)
Monocytes Absolute: 0.9 10*3/uL (ref 0.1–1.0)
Monocytes Relative: 7 %
Neutro Abs: 9.5 10*3/uL — ABNORMAL HIGH (ref 1.7–7.7)
Neutrophils Relative %: 78 %
Platelets: 324 10*3/uL (ref 150–400)
RBC: 4.85 MIL/uL (ref 3.87–5.11)
RDW: 12.7 % (ref 11.5–15.5)
WBC: 12.3 10*3/uL — ABNORMAL HIGH (ref 4.0–10.5)
nRBC: 0 % (ref 0.0–0.2)

## 2023-02-25 LAB — LIPASE, BLOOD: Lipase: 34 U/L (ref 11–51)

## 2023-02-25 MED ORDER — SODIUM CHLORIDE 0.9 % IV BOLUS (SEPSIS)
1000.0000 mL | Freq: Once | INTRAVENOUS | Status: AC
  Administered 2023-02-25: 1000 mL via INTRAVENOUS

## 2023-02-25 MED ORDER — ONDANSETRON HCL 4 MG/2ML IJ SOLN
4.0000 mg | Freq: Once | INTRAMUSCULAR | Status: AC
  Administered 2023-02-25: 4 mg via INTRAVENOUS
  Filled 2023-02-25: qty 2

## 2023-02-25 MED ORDER — ONDANSETRON 4 MG PO TBDP
4.0000 mg | ORAL_TABLET | Freq: Three times a day (TID) | ORAL | 0 refills | Status: AC | PRN
Start: 2023-02-25 — End: ?

## 2023-02-25 NOTE — ED Notes (Signed)
Pt tolerating fluids.   

## 2023-02-25 NOTE — ED Notes (Signed)
EDP at bedside  

## 2023-02-25 NOTE — ED Notes (Signed)
Discharge instructions provided by EDP discussed with pt. Pt verbalized understanding with no additional questions at this time.

## 2023-02-25 NOTE — ED Provider Notes (Signed)
[facility] Provider Note   CSN: [id number] Arrival date & time: 02/25/23  0416     History  Chief Complaint  Patient presents with   Emesis    BIONCA LEARNED is a 72 y.o. female.  The history is provided by the patient.  Patient with history of diabetes presents with nausea vomiting diarrhea. Patient reports a recent increase in her Ozempic.  This was followed by abdominal cramping and multiple episodes of nonbloody diarrhea.  She reports a few episodes of nonbloody emesis.  She reports occasional abdominal cramping.  No fevers.  No recent travel, no antibiotics.  No known sick contacts   Past Medical History:  Diagnosis Date   Diabetes mellitus without complication ([place])    Thyroid disease     Home Medications Prior to Admission medications   Medication Sig Start Date End Date Taking? Authorizing Provider  ascorbic acid (VITAMIN C) 500 MG tablet Take 500 mg by mouth daily.   Yes [provider]  aspirin-acetaminophen-caffeine (EXCEDRIN MIGRAINE) [phone] MG tablet Take 2 tablets by mouth every 6 (six) hours as needed for headache or migraine.   Yes [provider]  calcium citrate (CALCITRATE - DOSED IN MG ELEMENTAL CALCIUM) 950 (200 Ca) MG tablet Take 200 mg of elemental calcium by mouth daily.   Yes [provider]  cetirizine (ZYRTEC) 10 MG tablet Take 10 mg by mouth daily.   Yes [provider]  EPINEPHrine (EPIPEN 2-PAK) 0.3 mg/0.3 mL IJ SOAJ injection Use as directed for life-threatening allergic reaction. 01/26/21  Yes Kozlow, Donnamarie Poag, MD  ibuprofen (ADVIL) 200 MG tablet Take 200 mg by mouth every 6 (six) hours as needed for moderate pain.   Yes [provider]  levothyroxine (SYNTHROID) 50 MCG tablet Take 50 mcg by mouth daily. 02/09/23  Yes [provider]  Multiple Vitamin (MULTIVITAMIN) tablet Take 1 tablet by mouth daily.   Yes [provider]  Multiple  Vitamins-Minerals (ZINC PO) Take 1 tablet by mouth daily.   Yes [provider]  ondansetron (ZOFRAN-ODT) 4 MG disintegrating tablet Take 1 tablet (4 mg total) by mouth every 8 (eight) hours as needed. 02/25/23  Yes Ripley Fraise, MD  OZEMPIC, 0.25 OR 0.5 MG/DOSE, 2 MG/3ML SOPN Inject 0.5 mg into the skin once a week. 02/09/23  Yes [provider]  pseudoephedrine (SUDAFED) 60 MG tablet Take 60 mg by mouth every 4 (four) hours as needed for congestion.   Yes [provider]  rosuvastatin (CRESTOR) 10 MG tablet Take 10 mg by mouth daily. 09/30/22  Yes [provider]  venlafaxine (EFFEXOR) 75 MG tablet Take 225 mg by mouth every morning.   Yes [provider]  Vitamin D, Ergocalciferol, 50000 units CAPS Take 1 capsule by mouth 2 (two) times a week. 01/11/23  Yes [provider]      Allergies    Latex and Other    Review of Systems   Review of Systems  Constitutional:  Negative for fever.  Cardiovascular:  Negative for chest pain.  Gastrointestinal:  Positive for diarrhea and vomiting. Negative for blood in stool.    Physical Exam Updated Vital Signs BP (!) 159/75   Pulse 82   Temp 97.9 F (36.6 C) (Oral)   Resp (!) 21   SpO2 90%  Physical Exam CONSTITUTIONAL: Well developed/well nourished HEAD: Normocephalic/atraumatic EYES: EOMI/PERRL, no icterus, conjunctiva pink ENMT: Mucous membranes dry NECK: supple no meningeal signs SPINE/BACK:entire spine nontender  CV: S1/S2 noted, no murmurs/rubs/gallops noted LUNGS: Lungs are clear to auscultation bilaterally, no apparent distress ABDOMEN: soft, mild diffuse tenderness, no rebound or guarding, bowel sounds noted throughout abdomen GU:no cva tenderness NEURO: Pt is awake/alert/appropriate, moves all extremitiesx4.  No facial droop.   EXTREMITIES: pulses normal/equal, full ROM SKIN: warm, pale PSYCH: no abnormalities of mood noted, alert and oriented to situation  ED Results /  Procedures / Treatments   Labs (all labs ordered are listed, but only abnormal results are displayed) Labs Reviewed  COMPREHENSIVE METABOLIC PANEL - Abnormal; Notable for the following components:      Result Value   Glucose, Bld 173 (*)    BUN 25 (*)    Total Protein 8.3 (*)    All other components within normal limits  CBC WITH DIFFERENTIAL/PLATELET - Abnormal; Notable for the following components:   WBC 12.3 (*)    Neutro Abs 9.5 (*)    All other components within normal limits  LIPASE, BLOOD    EKG EKG Interpretation  Date/Time:  Friday February 25 2023 05:01:10 EDT Ventricular Rate:  85 PR Interval:  167 QRS Duration: 99 QT Interval:  381 QTC Calculation: 453 R Axis:   -6 Text Interpretation: Sinus rhythm Left ventricular hypertrophy Confirmed by Ripley Fraise [phone]) on 02/25/2023 5:11:46 AM  Radiology No results found.  Procedures Procedures    Medications Ordered in ED Medications  sodium chloride 0.9 % bolus 1,000 mL (0 mLs Intravenous Stopped 02/25/23 0633)  ondansetron (ZOFRAN) injection 4 mg (4 mg Intravenous Given 02/25/23 0511)    ED Course/ Medical Decision Making/ A&P Clinical Course as of 02/25/23 0654  Fri Feb 25, 2023  0530 WBC(!): 12.3 leukocytosis [DW]  0632 Pt feeling improved, taking PO fluids [DW]  0653 Patient continues to improve.  She is tolerating p.o. fluids.  Vitals are appropriate.  No abdominal pain.  This could be med reaction from Ozempic versus gastroenteritis.  At this point no indication for imaging.  Short course of  Zofran has been provided.  We discussed strict return precautions [DW]    Clinical Course User Index [DW] Ripley Fraise, MD                             Medical Decision Making Amount and/or Complexity of Data Reviewed Labs: ordered. Decision-making details documented in ED Course. ECG/medicine tests: ordered.  Risk Prescription drug management.   This patient presents to the ED for concern of abdominal  pain, this involves an extensive number of treatment options, and is a complaint that carries with it a high risk of complications and morbidity.  The differential diagnosis includes but is not limited to cholecystitis, cholelithiasis, pancreatitis, gastritis, peptic ulcer disease, appendicitis, bowel obstruction, bowel perforation, diverticulitis, AAA, ischemic bowel, gastroenteritis   Comorbidities that complicate the patient evaluation: Patient's presentation is complicated by their history of diabetes   Additional history obtained: Additional history obtained from significant other Records reviewed  outpatient records reviewed  Lab Tests: I Ordered, and personally interpreted labs.  The pertinent results include:  leukocytosis, dehydration   Medicines ordered and prescription drug management: I ordered medication including zofran and IV fluids for nausea Reevaluation of the patient after these medicines showed that the patient    improved  Test Considered: No indication for imaging as patient is improving/tolerating PO fluids   Reevaluation: After the interventions noted above, I reevaluated the patient and found that they have :improved  Complexity  of problems addressed: Patient's presentation is most consistent with  acute presentation with potential threat to life or bodily function  Disposition: After consideration of the diagnostic results and the patient's response to treatment,  I feel that the patent would benefit from discharge   .           Final Clinical Impression(s) / ED Diagnoses Final diagnoses:  Dehydration  Nausea vomiting and diarrhea    Rx / DC Orders ED Discharge Orders          Ordered    ondansetron (ZOFRAN-ODT) 4 MG disintegrating tablet  Every 8 hours PRN        02/25/23 0631              Ripley Fraise, MD 02/25/23 [phone]

## 2023-02-25 NOTE — ED Triage Notes (Signed)
Pt reports n/v/d all night, reports recent increase in ozempic, no fevers.
